# Patient Record
Sex: Female | Born: 1990 | Race: Black or African American | Hispanic: No | Marital: Married | Smoking: Never smoker
Health system: Southern US, Community
[De-identification: ages and names within clinical notes are randomized; demographics above are authoritative.]

## PROBLEM LIST (undated history)

## (undated) DIAGNOSIS — I10 Essential (primary) hypertension: Secondary | ICD-10-CM

## (undated) DIAGNOSIS — R87629 Unspecified abnormal cytological findings in specimens from vagina: Secondary | ICD-10-CM

## (undated) DIAGNOSIS — O10919 Unspecified pre-existing hypertension complicating pregnancy, unspecified trimester: Secondary | ICD-10-CM

## (undated) HISTORY — DX: Unspecified abnormal cytological findings in specimens from vagina: R87.629

## (undated) HISTORY — DX: Unspecified pre-existing hypertension complicating pregnancy, unspecified trimester: O10.919

## (undated) HISTORY — DX: Essential (primary) hypertension: I10

---

## 2010-10-10 DIAGNOSIS — O139 Gestational [pregnancy-induced] hypertension without significant proteinuria, unspecified trimester: Secondary | ICD-10-CM

## 2015-07-05 LAB — OB RESULTS CONSOLE GC/CHLAMYDIA
Chlamydia: NEGATIVE
Gonorrhea: NEGATIVE

## 2015-08-03 ENCOUNTER — Encounter: Payer: Self-pay | Admitting: *Deleted

## 2015-08-03 DIAGNOSIS — I10 Essential (primary) hypertension: Secondary | ICD-10-CM

## 2015-08-03 DIAGNOSIS — Z98891 History of uterine scar from previous surgery: Secondary | ICD-10-CM

## 2015-08-03 DIAGNOSIS — O99210 Obesity complicating pregnancy, unspecified trimester: Secondary | ICD-10-CM | POA: Insufficient documentation

## 2015-08-03 DIAGNOSIS — O34219 Maternal care for unspecified type scar from previous cesarean delivery: Secondary | ICD-10-CM | POA: Insufficient documentation

## 2015-08-03 DIAGNOSIS — O0990 Supervision of high risk pregnancy, unspecified, unspecified trimester: Secondary | ICD-10-CM | POA: Insufficient documentation

## 2015-08-06 LAB — HCG, QUANTITATIVE, PREGNANCY
GC Probe Amp, Genital: NEGATIVE
HEMATOCRIT: 33 %
HEMOGLOBIN (KUC): 10.6 g/dL — AB (ref 11.8–15.5)
PLATELETS: 340
Pap Smear: NEGATIVE
hCG Value: 108855

## 2015-08-28 ENCOUNTER — Encounter: Payer: Self-pay | Admitting: *Deleted

## 2015-08-28 ENCOUNTER — Ambulatory Visit (INDEPENDENT_AMBULATORY_CARE_PROVIDER_SITE_OTHER): Payer: BLUE CROSS/BLUE SHIELD | Admitting: Obstetrics & Gynecology

## 2015-08-28 ENCOUNTER — Encounter: Payer: Self-pay | Admitting: Obstetrics & Gynecology

## 2015-08-28 VITALS — BP 132/78 | HR 90 | Temp 98.4°F | Ht 62.0 in | Wt 300.0 lb

## 2015-08-28 DIAGNOSIS — O10911 Unspecified pre-existing hypertension complicating pregnancy, first trimester: Secondary | ICD-10-CM

## 2015-08-28 DIAGNOSIS — I10 Essential (primary) hypertension: Secondary | ICD-10-CM

## 2015-08-28 DIAGNOSIS — O99211 Obesity complicating pregnancy, first trimester: Secondary | ICD-10-CM

## 2015-08-28 DIAGNOSIS — O34219 Maternal care for unspecified type scar from previous cesarean delivery: Secondary | ICD-10-CM

## 2015-08-28 DIAGNOSIS — O0990 Supervision of high risk pregnancy, unspecified, unspecified trimester: Secondary | ICD-10-CM

## 2015-08-28 LAB — POCT URINALYSIS DIP (DEVICE)
Bilirubin Urine: NEGATIVE
Glucose, UA: NEGATIVE mg/dL
Ketones, ur: NEGATIVE mg/dL
Nitrite: NEGATIVE
PROTEIN: NEGATIVE mg/dL
SPECIFIC GRAVITY, URINE: 1.02 (ref 1.005–1.030)
UROBILINOGEN UA: 0.2 mg/dL (ref 0.0–1.0)
pH: 7 (ref 5.0–8.0)

## 2015-08-28 NOTE — Progress Notes (Signed)
Records received and abstracted. No prenatal profile done, will need drawn next visit.   Here for first prenatal visit. Given new ob education packet. Reports 3 episodes of vaginal bleeding.States first was dark , clotted. 2nd and 3rd episode were lighter red, light amount.  Saw Dr. Quincy SheehanHolyfield after each episode. Last episode was last week, no further bleeding. Declines flu.

## 2015-08-28 NOTE — Patient Instructions (Signed)
Vaginal Birth After Cesarean Delivery  Vaginal birth after cesarean delivery (VBAC) is giving birth vaginally after previously delivering a baby by a cesarean. In the past, if a woman had a cesarean delivery, all births afterward would be done by cesarean delivery. This is no longer true. It can be safe for the mother to try a vaginal delivery after having a cesarean delivery.   It is important to discuss VBAC with your health care provider early in the pregnancy so you can understand the risks, benefits, and options. It will give you time to decide what is best in your particular case. The final decision about whether to have a VBAC or repeat cesarean delivery should be between you and your health care provider. Any changes in your health or your baby's health during your pregnancy may make it necessary to change your initial decision about VBAC.   WOMEN WHO PLAN TO HAVE A VBAC SHOULD CHECK WITH THEIR HEALTH CARE PROVIDER TO BE SURE THAT:  · The previous cesarean delivery was done with a low transverse uterine cut (incision) (not a vertical classical incision).    · The birth canal is big enough for the baby.    · There were no other operations on the uterus.    · An electronic fetal monitor (EFM) will be on at all times during labor.    · An operating room will be available and ready in case an emergency cesarean delivery is needed.    · A health care provider and surgical nursing staff will be available at all times during labor to be ready to do an emergency delivery cesarean if necessary.    · An anesthesiologist will be present in case an emergency cesarean delivery is needed.    · The nursery is prepared and has adequate personnel and necessary equipment available to care for the baby in case of an emergency cesarean delivery.  BENEFITS OF VBAC  · Shorter stay in the hospital.    · Avoidance of risks associated with cesarean delivery, such as:    Surgical complications, such as opening of the incision or  hernia in the incision.    Injury to other organs.    Fever. This can occur if an infection develops after surgery. It can also occur as a reaction to the medicine given to make you numb during the surgery.  · Less blood loss and need for blood transfusions.  · Lower risk of blood clots and infection.   · Shorter recovery.    · Decreased risk for having to remove the uterus (hysterectomy).    · Decreased risk for the placenta to completely or partially cover the opening of the uterus (placenta previa) with a future pregnancy.    · Decrease risk in future labor and delivery.  RISKS OF A VBAC  · Tearing (rupture) of the uterus. This is occurs in less than 1% of VBACs. The risk of this happening is higher if:    Steps are taken to begin the labor process (induce labor) or stimulate or strengthen contractions (augment labor).      Medicine is used to soften (ripen) the cervix.  · Having to remove the uterus (hysterectomy) if it ruptures.  VBAC SHOULD NOT BE DONE IF:  · The previous cesarean delivery was done with a vertical (classical) or T-shaped incision or you do not know what kind of incision was made.    · You had a ruptured uterus.    · You have had certain types of surgery on your uterus, such as removal of uterine fibroids.   Ask your health care provider about other types of surgeries that prevent you from having a VBAC.  · You have certain medical or childbirth (obstetrical) problems.    · There are problems with the baby.    · You have had two previous cesarean deliveries and no vaginal deliveries.  OTHER FACTS TO KNOW ABOUT VBAC:  · It is safe to have an epidural anesthetic with VBAC.    · It is safe to turn the baby from a breech position (attempt an external cephalic version).    · It is safe to try a VBAC with twins.    · VBAC may not be successful if your baby weights 8.8 lb (4 kg) or more. However, weight predictions are not always accurate and should not be used alone to decide if VBAC is right for  you.  · There is an increased failure rate if the time between the cesarean delivery and VBAC is less than 19 months.    · Your health care provider may advise against a VBAC if you have preeclampsia (high blood pressure, protein in the urine, and swelling of face and extremities).    · VBAC is often successful if you previously gave birth vaginally.    · VBAC is often successful when the labor starts spontaneously before the due date.    · Delivering a baby through a VBAC is similar to having a normal spontaneous vaginal delivery.     This information is not intended to replace advice given to you by your health care provider. Make sure you discuss any questions you have with your health care provider.     Document Released: 02/02/2007 Document Revised: 09/02/2014 Document Reviewed: 03/11/2013  Elsevier Interactive Patient Education ©2016 Elsevier Inc.

## 2015-08-28 NOTE — Progress Notes (Signed)
[facility]  Subjective:referred [facility] due to h/o wound infection after cesarean and consider TOLAC    Sierra [name] is a [id number] 6534w5d being seen today for her first obstetrical visit.  Her obstetrical history is significant for obesity and previous cesarean section. Patient does intend to breast feed. Pregnancy history fully reviewed.  Patient reports nausea.  Filed Vitals:   08/28/15 0822 08/28/15 0829  BP:  132/78  Pulse:  90  Temp:  98.4 F (36.9 C)  Height: 5\' 2"  (1.575 m)   Weight:  136.079 kg (300 lb)    HISTORY: OB History  Gravida Para Term Preterm AB SAB TAB Ectopic Multiple Living  5 1 1  0 3 1 0 0 0 1    # Outcome Date GA Lbr Len/2nd Weight Sex Delivery Anes PTL Lv  5 Current           4 AB 2015          3 SAB 03/2013          2 AB 2014          1 Term 10/10/10   3.629 kg (8 lb) M CS-Unspec  N Y     Complications: PIH (pregnancy induced hypertension),Failure to progress in labor     Past Medical History  Diagnosis Date  . Hypertension   . Vaginal Pap smear, abnormal    Past Surgical History  Procedure Laterality Date  . Cesarean section     Family History  Problem Relation Age of Onset  . Hypertension Mother   . Kidney disease Father   . Diabetes Father   . High Cholesterol Father   . Autoimmune disease Father   . Cancer Paternal Grandmother     ovarian     Exam    Uterus:     Pelvic Exam:                                    Skin: normal coloration and turgor, no rashes    Neurologic: oriented, normal mood   Extremities: normal strength, tone, and muscle mass   HEENT PERRLA and sclera clear, anicteric   Mouth/Teeth dental hygiene good   Neck supple   Cardiovascular: regular rate and rhythm   Respiratory:  appears well, vitals normal, no respiratory distress, acyanotic, normal RR   Abdomen: soft, non-tender; bowel sounds normal; no masses,  no organomegaly     Assessment:    Pregnancy:  G4W1027G5P1031 Patient Active Problem List   Diagnosis Date Noted  . Supervision of high-risk pregnancy 08/03/2015  . History of C-section 08/03/2015  . Obesities, morbid (HCC) 08/03/2015  . Obesity in pregnancy 08/03/2015  . HTN (hypertension) 08/03/2015        Plan:     Initial labs drawn. Prenatal vitamins. Problem list reviewed and updated. Genetic Screening discussed First Screen: ordered.  Ultrasound discussed; fetal survey: 18 weeks.  Follow up in 4 weeks. 50% of 30 min visit spent on counseling and coordination of care.  TOLAC discussed   ARNOLD,JAMES 08/28/2015

## 2015-08-29 ENCOUNTER — Other Ambulatory Visit: Payer: Self-pay | Admitting: Obstetrics & Gynecology

## 2015-08-29 DIAGNOSIS — I10 Essential (primary) hypertension: Secondary | ICD-10-CM

## 2015-08-29 DIAGNOSIS — O0990 Supervision of high risk pregnancy, unspecified, unspecified trimester: Secondary | ICD-10-CM

## 2015-08-30 ENCOUNTER — Ambulatory Visit (HOSPITAL_COMMUNITY)
Admission: RE | Admit: 2015-08-30 | Discharge: 2015-08-30 | Disposition: A | Discharge status: Home / Self Care | Payer: BLUE CROSS/BLUE SHIELD | Source: Ambulatory Visit | Attending: Obstetrics & Gynecology | Admitting: Obstetrics & Gynecology

## 2015-08-30 ENCOUNTER — Other Ambulatory Visit (HOSPITAL_COMMUNITY): Payer: BLUE CROSS/BLUE SHIELD

## 2015-08-30 ENCOUNTER — Other Ambulatory Visit: Payer: Self-pay | Admitting: Obstetrics & Gynecology

## 2015-08-30 DIAGNOSIS — Z3A13 13 weeks gestation of pregnancy: Secondary | ICD-10-CM

## 2015-08-30 DIAGNOSIS — Z369 Encounter for antenatal screening, unspecified: Secondary | ICD-10-CM

## 2015-08-30 DIAGNOSIS — O34219 Maternal care for unspecified type scar from previous cesarean delivery: Secondary | ICD-10-CM

## 2015-08-30 DIAGNOSIS — O10011 Pre-existing essential hypertension complicating pregnancy, first trimester: Secondary | ICD-10-CM | POA: Diagnosis present

## 2015-08-30 DIAGNOSIS — O10019 Pre-existing essential hypertension complicating pregnancy, unspecified trimester: Secondary | ICD-10-CM

## 2015-08-30 DIAGNOSIS — O0990 Supervision of high risk pregnancy, unspecified, unspecified trimester: Secondary | ICD-10-CM

## 2015-08-30 DIAGNOSIS — Z36 Encounter for antenatal screening of mother: Secondary | ICD-10-CM | POA: Diagnosis not present

## 2015-08-30 DIAGNOSIS — O99211 Obesity complicating pregnancy, first trimester: Secondary | ICD-10-CM

## 2015-08-30 DIAGNOSIS — I10 Essential (primary) hypertension: Secondary | ICD-10-CM

## 2015-08-30 LAB — PRESCRIPTION MONITORING PROFILE (19 PANEL)
Amphetamine/Meth: NEGATIVE ng/mL
BARBITURATE SCREEN, URINE: NEGATIVE ng/mL
BENZODIAZEPINE SCREEN, URINE: NEGATIVE ng/mL
BUPRENORPHINE, URINE: NEGATIVE ng/mL
CANNABINOID SCRN UR: NEGATIVE ng/mL
CARISOPRODOL, URINE: NEGATIVE ng/mL
COCAINE METABOLITES: NEGATIVE ng/mL
CREATININE, URINE: 183.05 mg/dL (ref >20.0)
Fentanyl, Ur: NEGATIVE ng/mL
MDMA URINE: NEGATIVE ng/mL
METHAQUALONE SCREEN (URINE): NEGATIVE ng/mL
Meperidine, Ur: NEGATIVE ng/mL
Methadone Screen, Urine: NEGATIVE ng/mL
Nitrites, Initial: NEGATIVE ug/mL
OPIATE SCREEN, URINE: NEGATIVE ng/mL
OXYCODONE SCRN UR: NEGATIVE ng/mL
PH URINE, INITIAL: 7.5 pH (ref 4.5–8.9)
PHENCYCLIDINE, UR: NEGATIVE ng/mL
Propoxyphene: NEGATIVE ng/mL
Tapentadol, urine: NEGATIVE ng/mL
Tramadol Scrn, Ur: NEGATIVE ng/mL
Zolpidem, Urine: NEGATIVE ng/mL

## 2015-08-30 LAB — GLUCOSE TOLERANCE, 1 HOUR (50G) W/O FASTING: GLUCOSE 1 HOUR GTT: 99 mg/dL (ref 70–140)

## 2015-09-02 LAB — CULTURE, OB URINE

## 2015-09-04 ENCOUNTER — Telehealth: Payer: Self-pay | Admitting: *Deleted

## 2015-09-04 DIAGNOSIS — O2342 Unspecified infection of urinary tract in pregnancy, second trimester: Secondary | ICD-10-CM

## 2015-09-04 MED ORDER — NITROFURANTOIN MONOHYD MACRO 100 MG PO CAPS: 100.0000 mg | ORAL_CAPSULE | Freq: Two times a day (BID) | ORAL | Status: DC

## 2015-09-04 NOTE — Telephone Encounter (Signed)
Per Dr. Debroah LoopArnold urine culture shows UTI with E. Coli. RX ordered.   Called [facility] and informed her of UTI and need to take macrobid and that I will sent it to her pharmacy for her to start asap.  I informed her she should take her medicine bid until all doses taken. She voices understanding.

## 2015-09-28 ENCOUNTER — Ambulatory Visit (INDEPENDENT_AMBULATORY_CARE_PROVIDER_SITE_OTHER): Payer: BLUE CROSS/BLUE SHIELD | Admitting: Obstetrics & Gynecology

## 2015-09-28 VITALS — BP 124/78 | HR 92 | Temp 97.8°F | Wt 303.7 lb

## 2015-09-28 DIAGNOSIS — O10912 Unspecified pre-existing hypertension complicating pregnancy, second trimester: Secondary | ICD-10-CM | POA: Diagnosis not present

## 2015-09-28 DIAGNOSIS — O34219 Maternal care for unspecified type scar from previous cesarean delivery: Secondary | ICD-10-CM

## 2015-09-28 DIAGNOSIS — O10919 Unspecified pre-existing hypertension complicating pregnancy, unspecified trimester: Secondary | ICD-10-CM | POA: Insufficient documentation

## 2015-09-28 DIAGNOSIS — O99212 Obesity complicating pregnancy, second trimester: Secondary | ICD-10-CM

## 2015-09-28 DIAGNOSIS — O0992 Supervision of high risk pregnancy, unspecified, second trimester: Secondary | ICD-10-CM

## 2015-09-28 DIAGNOSIS — R079 Chest pain, unspecified: Secondary | ICD-10-CM

## 2015-09-28 DIAGNOSIS — O2342 Unspecified infection of urinary tract in pregnancy, second trimester: Secondary | ICD-10-CM

## 2015-09-28 LAB — COMPREHENSIVE METABOLIC PANEL
ALBUMIN: 3.3 g/dL — AB (ref 3.6–5.1)
ALT: 13 U/L (ref 6–29)
AST: 12 U/L (ref 10–30)
Alkaline Phosphatase: 39 U/L (ref 33–115)
BUN: 8 mg/dL (ref 7–25)
CHLORIDE: 107 mmol/L (ref 98–110)
CO2: 21 mmol/L (ref 20–31)
Calcium: 8.5 mg/dL — ABNORMAL LOW (ref 8.6–10.2)
Creat: 0.6 mg/dL (ref 0.50–1.10)
Glucose, Bld: 76 mg/dL (ref 65–99)
POTASSIUM: 3.9 mmol/L (ref 3.5–5.3)
Sodium: 138 mmol/L (ref 135–146)
Total Bilirubin: 0.2 mg/dL (ref 0.2–1.2)
Total Protein: 6.2 g/dL (ref 6.1–8.1)

## 2015-09-28 LAB — POCT URINALYSIS DIP (DEVICE)
Bilirubin Urine: NEGATIVE
GLUCOSE, UA: NEGATIVE mg/dL
KETONES UR: NEGATIVE mg/dL
Nitrite: POSITIVE — AB
Protein, ur: 30 mg/dL — AB
Urobilinogen, UA: 1 mg/dL (ref 0.0–1.0)
pH: 5.5 (ref 5.0–8.0)

## 2015-09-28 MED ORDER — SULFAMETHOXAZOLE-TRIMETHOPRIM 800-160 MG PO TABS: 1.0000 | ORAL_TABLET | Freq: Two times a day (BID) | ORAL | Status: DC

## 2015-09-28 MED ORDER — ASPIRIN EC 81 MG PO TBEC: 81.0000 mg | DELAYED_RELEASE_TABLET | Freq: Every day | ORAL | Status: DC

## 2015-09-28 NOTE — Progress Notes (Signed)
Subjective:  Sierra [name] is a 25 y.o. G5P1031 at [redacted]w[redacted]d being seen today for ongoing prenatal care.  She is currently monitored for the following issues for this high-risk pregnancy and has Supervision of high-risk pregnancy; Previous cesarean delivery, antepartum; Morbid obesity (HCC); Maternal morbid obesity, antepartum (HCC); HTN (hypertension); and Preexisting hypertension complicating pregnancy, antepartum on her problem list.  Patient reports intermittent left sided chest pain with SOB several times a day. Thinks it is heartburn, has not taken any Tums or other intervention. No radiation to arms or any other part of body. No history of heart issues but does have HTN.  No current chest pain and SOB.  Contractions: Not present. Vag. Bleeding: None.  Movement: Present. Denies leaking of fluid.   The following portions of the patient's history were reviewed and updated as appropriate: allergies, current medications, past family history, past medical history, past social history, past surgical history and problem list. Problem list updated.  Objective:   Filed Vitals:   09/28/15 1025  BP: 124/78  Pulse: 92  Temp: 97.8 F (36.6 C)  Weight: 303 lb 11.2 oz (137.757 kg)    Fetal Status: Fetal Heart Rate (bpm): 150   Movement: Present     General:  Alert, oriented and cooperative. Patient is in no acute distress.  Skin: Skin is warm and dry. No rash noted.   Cardiovascular: Normal heart rate noted  Respiratory: Normal respiratory effort, no problems with respiration noted  Abdomen: Soft, gravid, appropriate for gestational age. Pain/Pressure: Present     Pelvic: Vag. Bleeding: None    Cervical exam deferred        Extremities: Normal range of motion.  Edema: Trace  Mental Status: Normal mood and affect. Normal behavior. Normal judgment and thought content.   Urinalysis: Urine Protein: 1+ Urine Glucose: Negative  Assessment and Plan:  Pregnancy: G5P1031 at [redacted]w[redacted]d  1. Chest pain,  unspecified chest pain type Patient was told to try antacids but to go to ED for any worsening/recurrent symptoms; may need EKG/ECHO. Will check labs today. - B Nat Peptide - Troponin I  2. Preexisting hypertension complicating pregnancy, antepartum, second trimester Normotensive today.  Encouraged to start low dose ASA, this was prescribed. - [place] MFM OB COMP + 14 WK; Future - Prenatal Profile - Comprehensive metabolic panel - Protein / creatinine ratio, urine - aspirin EC 81 MG tablet; Take 1 tablet (81 mg total) by mouth daily. Take after 12 weeks for prevention of preeclampsia later in pregnancy  Dispense: 300 tablet; Refill: 2  3. UTI (urinary tract infection) in pregnancy, antepartum, second trimester Had culture with E.coli resistant to PCNs/cephalosporins. Had Macrobid, still symptomatic. Bactrim prescribed today. - sulfamethoxazole-trimethoprim (BACTRIM DS,SEPTRA DS) 800-160 MG tablet; Take 1 tablet by mouth 2 (two) times daily.  Dispense: 14 tablet; Refill: 1  4. Maternal morbid obesity, antepartum, second trimester (HCC) 1 hr GTT 99, will see Nutritionist today - [place] MFM OB COMP + 14 WK; Future  5. Previous cesarean delivery, antepartum Desires TOLAC, will sign papers later. Information given to her to review at home.  6. Supervision of high-risk pregnancy, second trimester Needs routine labs, anatomy scan - [place] MFM OB COMP + 14 WK; Future - Hemoglobinopathy evaluation - Prenatal Profile - AFP, Quad Screen  Routine obstetric precautions reviewed. Please refer to After Visit Summary for other counseling recommendations.  Return in about 4 weeks (around 10/26/2015) for OB Visit.   Tereso Newcomer, MD

## 2015-09-28 NOTE — Patient Instructions (Signed)
Return to clinic for any obstetric concerns or go to [facility] for evaluation  Trial of Labor After Cesarean Delivery Information A trial of labor after cesarean delivery (TOLAC) is when a woman tries to give birth vaginally after a previous cesarean delivery. TOLAC may be a safe and appropriate option for you depending on your medical history and other risk factors. When TOLAC is successful and you are able to have a vaginal delivery, this is called a vaginal birth after cesarean delivery (VBAC).  CANDIDATES FOR TOLAC TOLAC is possible for some women who:  Have undergone one or two prior cesarean deliveries in which the incision of the uterus was horizontal (low transverse).  Are carrying twins and have had one prior low transverse incision during a cesarean delivery.  Do not have a vertical (classical) uterine scar.  Have not had a tear in the wall of their uterus (uterine rupture). TOLAC is also supported for women who meet appropriate criteria and:  Are under the age of 40 years.  Are tall and have a body mass index (BMI) of less than 30.  Have an unknown uterine scar.  Give birth in a facility equipped to handle an emergency cesarean delivery. This team should be able to handle possible complications such as a uterine rupture.  Have thorough counseling about the benefits and risks of TOLAC.  Have discussed future pregnancy plans with their health care provider.  Plan to have several more pregnancies. MOST SUCCESSFUL CANDIDATES FOR TOLAC:  Have had a successful vaginal delivery before or after their cesarean delivery.  Experience labor that begins naturally on or before the due date (40 weeks of gestation).  Do not have a very large (macrosomic) baby.   Had a prior cesarean delivery but are not currently experiencing factors that would prompt a cesarean delivery (such as a breech position).  Had only one prior cesarean delivery.  Had a prior cesarean delivery that was  performed early in labor and not after full cervical dilation. TOLAC may be most appropriate for women who meet the above guidelines and who plan to have more pregnancies. TOLAC is not recommended for home births. LEAST SUCCESSFUL CANDIDATES FOR TOLAC:  Have an induced labor with an unfavorable cervix. An unfavorable cervix is when the cervix is not dilating enough (among other factors).  Have never had a vaginal delivery.  Have had more than two cesarean deliveries.  Have a pregnancy at more than 40 weeks of gestation.  Are pregnant with a baby with a suspected weight greater than 4,000 grams (8 pounds) and who have no prior history of a vaginal delivery.  Have closely spaced pregnancies. SUGGESTED BENEFITS OF TOLAC  You may have a faster recovery time.  You may have a shorter stay in the hospital.  You may have less pain and fewer problems than with a cesarean delivery. Women who have a cesarean delivery have a higher chance of needing blood or getting a fever, an infection, or a blood clot in the legs. SUGGESTED RISKS OF TOLAC The highest risk of complications happens to women who attempt a TOLAC and fail. A failed TOLAC results in an unplanned cesarean delivery. Risks related to TOLAC or repeat cesarean deliveries include:   Blood loss.  Infection.  Blood clot.  Injury to surrounding tissues or organs.  Having to remove the uterus (hysterectomy).  Potential problems with the placenta (such as placenta previa or placenta accreta) in future pregnancies. Although very rare, the main concerns with TOLAC are:    Rupture of the uterine scar from a past cesarean delivery.  Needing an emergency cesarean delivery.  Having a bad outcome for the baby (perinatal morbidity). FOR MORE INFORMATION American Congress of Obstetricians and Gynecologists: www.acog.org American College of Nurse-Midwives: www.midwife.org   This information is not intended to replace advice given to you by your  health care provider. Make sure you discuss any questions you have with your health care provider.   Document Released: 04/30/2011 Document Revised: 06/02/2013 Document Reviewed: 02/01/2013 Elsevier Interactive Patient Education 2016 Elsevier Inc.  

## 2015-09-28 NOTE — Addendum Note (Signed)
Addended by: Garret Reddish on: 09/28/2015 02:38 PM   Modules accepted: Orders

## 2015-09-28 NOTE — Progress Notes (Signed)
Nutrition note: 1st consult Pt has h/o obesity. Pt has lost 11.3# @ [redacted]w[redacted]d but has gained 3.7# in the past 4 wks. Pt reports she had N&V in the beginning but that it has improved. Pt also reports that she has been making some healthy changes such as eating more salads & fruit. Pt reports eating 2 meals & 3 snacks/d. Pt reports she does not eat until 10am (but is awake ~5am).  Pt is taking a PNV. Pt reports some nausea still and has heartburn. NKFA. Pt received verbal & written education on general nutrition during pregnancy. Discussed importance of eating breakfast soon after she wakes. Discussed tips to decrease N&V and heartburn. Discussed how sometimes wt loss during pregnancy can be from making healthy food choices - reviewed recommended number of servings of each food group & encouraged pt to compare her intake to these recommendations to make sure she is eating what she needs to during pregnancy. Discussed wt gain goals of 11-20# or 0.5#/wk. Pt agrees to try to start eating something closer to when she wakes up. Pt does not have WIC but lives in [place] & plans to apply soon. Pt plans to BF. F/u as needed Blondell Reveal, MS, RD, LDN, [facility]

## 2015-09-28 NOTE — Progress Notes (Signed)
Urine: positive nitrites, small amt wbcs, trace blood Pt c/o chest pain, left side with sob intermittently several times a day Breastfeeding tip of the week reviewed

## 2015-09-28 NOTE — Progress Notes (Signed)
Cancelled urine protein/creatinine ratio due to insufficient sample provided. Will obtain at next visit.

## 2015-09-28 NOTE — Progress Notes (Signed)
U/S scheduled fro 10/18/2015 :00AM

## 2015-09-29 LAB — AFP, QUAD SCREEN
AFP: 24 ng/mL
Curr Gest Age: 17.1 wks.days
HCG TOTAL: 20.01 [IU]/mL
INH: 74.2 pg/mL
INTERPRETATION-AFP: NEGATIVE
MOM FOR AFP: 1.18
MoM for INH: 0.72
MoM for hCG: 1.08
OPEN SPINA BIFIDA: NEGATIVE
Osb Risk: 1:7340 {titer}
Tri 18 Scr Risk Est: NEGATIVE
UE3 VALUE: 0.73 ng/mL
uE3 Mom: 0.82

## 2015-09-29 LAB — PRENATAL PROFILE (SOLSTAS)
ANTIBODY SCREEN: NEGATIVE
BASOS PCT: 0 % (ref 0–1)
Basophils Absolute: 0 10*3/uL (ref 0.0–0.1)
EOS ABS: 0.1 10*3/uL (ref 0.0–0.7)
EOS PCT: 1 % (ref 0–5)
HEMATOCRIT: 34.8 % — AB (ref 36.0–46.0)
HEMOGLOBIN: 10.8 g/dL — AB (ref 12.0–15.0)
HEP B S AG: NEGATIVE
HIV 1&2 Ab, 4th Generation: NONREACTIVE
Lymphocytes Relative: 23 % (ref 12–46)
Lymphs Abs: 2 10*3/uL (ref 0.7–4.0)
MCH: 21.9 pg — ABNORMAL LOW (ref 26.0–34.0)
MCHC: 31 g/dL (ref 30.0–36.0)
MCV: 70.4 fL — ABNORMAL LOW (ref 78.0–100.0)
MONO ABS: 0.5 10*3/uL (ref 0.1–1.0)
MONOS PCT: 6 % (ref 3–12)
MPV: 10.7 fL (ref 8.6–12.4)
Neutro Abs: 6.2 10*3/uL (ref 1.7–7.7)
Neutrophils Relative %: 70 % (ref 43–77)
Platelets: 314 10*3/uL (ref 150–400)
RBC: 4.94 MIL/uL (ref 3.87–5.11)
RDW: 16 % — ABNORMAL HIGH (ref 11.5–15.5)
RH TYPE: POSITIVE
RUBELLA: 4.45 {index} — AB (ref <0.90)
WBC: 8.8 10*3/uL (ref 4.0–10.5)

## 2015-09-29 LAB — TROPONIN I

## 2015-09-29 LAB — BRAIN NATRIURETIC PEPTIDE: Brain Natriuretic Peptide: 20.7 pg/mL (ref 0.0–100.0)

## 2015-10-02 LAB — HEMOGLOBINOPATHY EVALUATION
HEMOGLOBIN OTHER: 0 %
HGB A2 QUANT: 2.2 % (ref 2.2–3.2)
HGB A: 97.8 % (ref 96.8–97.8)
HGB S QUANTITAION: 0 %
Hgb F Quant: 0 % (ref 0.0–2.0)

## 2015-10-18 ENCOUNTER — Other Ambulatory Visit: Payer: Self-pay | Admitting: Obstetrics & Gynecology

## 2015-10-18 ENCOUNTER — Ambulatory Visit (HOSPITAL_COMMUNITY)
Admission: RE | Admit: 2015-10-18 | Discharge: 2015-10-18 | Disposition: A | Discharge status: Home / Self Care | Payer: BLUE CROSS/BLUE SHIELD | Source: Ambulatory Visit | Attending: Obstetrics & Gynecology | Admitting: Obstetrics & Gynecology

## 2015-10-18 ENCOUNTER — Encounter (HOSPITAL_COMMUNITY): Payer: Self-pay

## 2015-10-18 VITALS — BP 118/65 | HR 83 | Wt 304.0 lb

## 2015-10-18 DIAGNOSIS — O99210 Obesity complicating pregnancy, unspecified trimester: Secondary | ICD-10-CM

## 2015-10-18 DIAGNOSIS — O34219 Maternal care for unspecified type scar from previous cesarean delivery: Secondary | ICD-10-CM | POA: Diagnosis not present

## 2015-10-18 DIAGNOSIS — O99212 Obesity complicating pregnancy, second trimester: Secondary | ICD-10-CM

## 2015-10-18 DIAGNOSIS — Z3A20 20 weeks gestation of pregnancy: Secondary | ICD-10-CM | POA: Insufficient documentation

## 2015-10-18 DIAGNOSIS — Z6841 Body Mass Index (BMI) 40.0 and over, adult: Secondary | ICD-10-CM | POA: Diagnosis not present

## 2015-10-18 DIAGNOSIS — O10912 Unspecified pre-existing hypertension complicating pregnancy, second trimester: Secondary | ICD-10-CM | POA: Insufficient documentation

## 2015-10-18 DIAGNOSIS — Z3689 Encounter for other specified antenatal screening: Secondary | ICD-10-CM

## 2015-10-18 DIAGNOSIS — Z36 Encounter for antenatal screening of mother: Secondary | ICD-10-CM | POA: Diagnosis not present

## 2015-10-18 DIAGNOSIS — O0992 Supervision of high risk pregnancy, unspecified, second trimester: Secondary | ICD-10-CM

## 2015-10-26 ENCOUNTER — Ambulatory Visit (INDEPENDENT_AMBULATORY_CARE_PROVIDER_SITE_OTHER): Payer: BLUE CROSS/BLUE SHIELD | Admitting: Obstetrics and Gynecology

## 2015-10-26 VITALS — BP 113/63 | HR 91 | Temp 98.2°F | Wt 301.5 lb

## 2015-10-26 DIAGNOSIS — O2340 Unspecified infection of urinary tract in pregnancy, unspecified trimester: Secondary | ICD-10-CM

## 2015-10-26 DIAGNOSIS — O0990 Supervision of high risk pregnancy, unspecified, unspecified trimester: Secondary | ICD-10-CM

## 2015-10-26 DIAGNOSIS — O10912 Unspecified pre-existing hypertension complicating pregnancy, second trimester: Secondary | ICD-10-CM

## 2015-10-26 DIAGNOSIS — Z23 Encounter for immunization: Secondary | ICD-10-CM

## 2015-10-26 DIAGNOSIS — O10919 Unspecified pre-existing hypertension complicating pregnancy, unspecified trimester: Secondary | ICD-10-CM

## 2015-10-26 DIAGNOSIS — O2342 Unspecified infection of urinary tract in pregnancy, second trimester: Secondary | ICD-10-CM

## 2015-10-26 LAB — POCT URINALYSIS DIP (DEVICE)
BILIRUBIN URINE: NEGATIVE
Bilirubin Urine: NEGATIVE
GLUCOSE, UA: NEGATIVE mg/dL
GLUCOSE, UA: NEGATIVE mg/dL
HGB URINE DIPSTICK: NEGATIVE
Hgb urine dipstick: NEGATIVE
Ketones, ur: NEGATIVE mg/dL
Ketones, ur: NEGATIVE mg/dL
NITRITE: POSITIVE — AB
Nitrite: POSITIVE — AB
Protein, ur: NEGATIVE mg/dL
Protein, ur: NEGATIVE mg/dL
SPECIFIC GRAVITY, URINE: 1.02 (ref 1.005–1.030)
Specific Gravity, Urine: 1.02 (ref 1.005–1.030)
UROBILINOGEN UA: 1 mg/dL (ref 0.0–1.0)
Urobilinogen, UA: 2 mg/dL — ABNORMAL HIGH (ref 0.0–1.0)
pH: 7 (ref 5.0–8.0)
pH: 7 (ref 5.0–8.0)

## 2015-10-26 MED ORDER — NITROFURANTOIN MONOHYD MACRO 100 MG PO CAPS: 100.0000 mg | ORAL_CAPSULE | Freq: Every day | ORAL | Status: DC

## 2015-10-26 MED ORDER — SULFAMETHOXAZOLE-TRIMETHOPRIM 800-160 MG PO TABS: 1.0000 | ORAL_TABLET | Freq: Two times a day (BID) | ORAL | Status: DC

## 2015-10-26 NOTE — Progress Notes (Signed)
positive nitrites and moderate leukocytes. Pt reports that it still burns when she urinates, urine is cloudy and has foul odor.

## 2015-10-26 NOTE — Progress Notes (Signed)
Subjective:  Sierra [name] is a 25 y.o. G5P1031 at [redacted]w[redacted]d being seen today for ongoing prenatal care.  She is currently monitored for the following issues for this high-risk pregnancy and has Supervision of high-risk pregnancy; Previous cesarean delivery, antepartum; Morbid obesity (HCC); Maternal morbid obesity, antepartum (HCC); HTN (hypertension); and Preexisting hypertension complicating pregnancy, antepartum on her problem list.  Patient reports burning when urinates. No fever or nausea or back pain.  Contractions: Not present. Vag. Bleeding: None.  Movement: Present. Denies leaking of fluid.   The following portions of the patient's history were reviewed and updated as appropriate: allergies, current medications, past family history, past medical history, past social history, past surgical history and problem list. Problem list updated.  Objective:   Filed Vitals:   10/26/15 1138  BP: 113/63  Pulse: 91  Temp: 98.2 F (36.8 C)  Weight: 301 lb 8 oz (136.76 kg)    Fetal Status: Fetal Heart Rate (bpm): 155   Movement: Present     General:  Alert, oriented and cooperative. Patient is in no acute distress.  Skin: Skin is warm and dry. No rash noted.   Cardiovascular: Normal heart rate noted  Respiratory: Normal respiratory effort, no problems with respiration noted  Abdomen: Soft, gravid, appropriate for gestational age. Pain/Pressure: Absent   No cva tenderness.   Pelvic: Vag. Bleeding: None     Cervical exam deferred        Extremities: Normal range of motion.  Edema: Trace  Mental Status: Normal mood and affect. Normal behavior. Normal judgment and thought content.   Urinalysis: Urine Protein: Negative Urine Glucose: Negative  Assessment and Plan:  Pregnancy: G5P1031 at [redacted]w[redacted]d  1. Supervision of high-risk pregnancy, unspecified trimester - flu vaccine today  2. Preexisting hypertension complicating pregnancy, antepartum, unspecified trimester - bp wnl. Needs upc but uti  today which would likely falsely elevate  # UTI - symptoms, ua suggestive - bactrim  - macrobid ppx - pyelo return precautions - f/u culture  Preterm labor symptoms and general obstetric precautions including but not limited to vaginal bleeding, contractions, leaking of fluid and fetal movement were reviewed in detail with the patient. Please refer to After Visit Summary for other counseling recommendations.  F/u 4 wks  Kathrynn Running, MD

## 2015-10-28 LAB — CULTURE, OB URINE

## 2015-11-23 ENCOUNTER — Ambulatory Visit (INDEPENDENT_AMBULATORY_CARE_PROVIDER_SITE_OTHER): Payer: BLUE CROSS/BLUE SHIELD | Admitting: Obstetrics & Gynecology

## 2015-11-23 VITALS — BP 120/71 | HR 89 | Temp 98.0°F | Wt 305.1 lb

## 2015-11-23 DIAGNOSIS — O34219 Maternal care for unspecified type scar from previous cesarean delivery: Secondary | ICD-10-CM

## 2015-11-23 DIAGNOSIS — O0992 Supervision of high risk pregnancy, unspecified, second trimester: Secondary | ICD-10-CM

## 2015-11-23 DIAGNOSIS — O10912 Unspecified pre-existing hypertension complicating pregnancy, second trimester: Secondary | ICD-10-CM

## 2015-11-23 LAB — POCT URINALYSIS DIP (DEVICE)
BILIRUBIN URINE: NEGATIVE
Glucose, UA: NEGATIVE mg/dL
HGB URINE DIPSTICK: NEGATIVE
Ketones, ur: NEGATIVE mg/dL
NITRITE: NEGATIVE
Protein, ur: NEGATIVE mg/dL
Specific Gravity, Urine: 1.025 (ref 1.005–1.030)
Urobilinogen, UA: 1 mg/dL (ref 0.0–1.0)
pH: 6 (ref 5.0–8.0)

## 2015-11-23 NOTE — Progress Notes (Signed)
Subjective:  Sierra [name] is a 25 y.o. G5P1031 at 1348w1d being seen today for ongoing prenatal care.  She is currently monitored for the following issues for this high-risk pregnancy and has Supervision of high-risk pregnancy; Previous cesarean delivery, antepartum; Morbid obesity (HCC); Maternal morbid obesity, antepartum (HCC); HTN (hypertension); Preexisting hypertension complicating pregnancy, antepartum; and Recurrent UTI (urinary tract infection) complicating pregnancy on her problem list.  Patient reports no complaints.  Contractions: Not present. Vag. Bleeding: None.  Movement: Present. Denies leaking of fluid.   The following portions of the patient's history were reviewed and updated as appropriate: allergies, current medications, past family history, past medical history, past social history, past surgical history and problem list. Problem list updated.  Objective:   Filed Vitals:   11/23/15 1134  BP: 120/71  Pulse: 89  Temp: 98 F (36.7 C)  Weight: 305 lb 1.6 oz (138.392 kg)    Fetal Status: Fetal Heart Rate (bpm): 145   Movement: Present     General:  Alert, oriented and cooperative. Patient is in no acute distress.  Skin: Skin is warm and dry. No rash noted.   Cardiovascular: Normal heart rate noted  Respiratory: Normal respiratory effort, no problems with respiration noted  Abdomen: Soft, gravid, appropriate for gestational age. Pain/Pressure: Present     Pelvic: Vag. Bleeding: None    Cervical exam deferred        Extremities: Normal range of motion.  Edema: Trace  Mental Status: Normal mood and affect. Normal behavior. Normal judgment and thought content.   Urinalysis: Urine Protein: Negative Urine Glucose: Negative  Assessment and Plan:  Pregnancy: G5P1031 at 1048w1d  1. Previous cesarean delivery, antepartum Counseled about TOLAC vs RCS, risks/benefits reviewed. Desires TOLAC. Consent signed.  2. Preexisting hypertension complicating pregnancy, antepartum,  second trimester Stable BP. Continue Aspirin. Growth scan scheduled at 28 weeks (12/14/15).  3. Supervision of high-risk pregnancy, second trimester Preterm labor symptoms and general obstetric precautions including but not limited to vaginal bleeding, contractions, leaking of fluid and fetal movement were reviewed in detail with the patient. Please refer to After Visit Summary for other counseling recommendations.  Return in 3 weeks (on 12/14/2015) for 1 hr GTT, OB Visit, 3rd trimester labs, TDap.   Tereso NewcomerUgonna A Anyanwu, MD

## 2015-11-23 NOTE — Patient Instructions (Addendum)
Return to clinic for any scheduled appointments or obstetric concerns, or go to [facility] for evaluation  Tdap Vaccine (Tetanus, Diphtheria and Pertussis): What You Need to Know 1. Why get vaccinated? Tetanus, diphtheria and pertussis are very serious diseases. Tdap vaccine can protect us from these diseases. And, Tdap vaccine given to pregnant women can protect newborn babies against pertussis. TETANUS (Lockjaw) is rare in the [place] today. It causes painful muscle tightening and stiffness, usually all over the body.  It can lead to tightening of muscles in the head and neck so you can't open your mouth, swallow, or sometimes even breathe. Tetanus kills about 1 out of 10 people who are infected even after receiving the best medical care. DIPHTHERIA is also rare in the [place] today. It can cause a thick coating to form in the back of the throat.  It can lead to breathing problems, heart failure, paralysis, and death. PERTUSSIS (Whooping Cough) causes severe coughing spells, which can cause difficulty breathing, vomiting and disturbed sleep.  It can also lead to weight loss, incontinence, and rib fractures. Up to 2 in 100 adolescents and 5 in 100 adults with pertussis are hospitalized or have complications, which could include pneumonia or death. These diseases are caused by bacteria. Diphtheria and pertussis are spread from person to person through secretions from coughing or sneezing. Tetanus enters the body through cuts, scratches, or wounds. Before vaccines, as many as 200,000 cases of diphtheria, 200,000 cases of pertussis, and hundreds of cases of tetanus, were reported in the [place] each year. Since vaccination began, reports of cases for tetanus and diphtheria have dropped by about 99% and for pertussis by about 80%. 2. Tdap vaccine Tdap vaccine can protect adolescents and adults from tetanus, diphtheria, and pertussis. One dose of Tdap is routinely given at age 11 or 12.  People who did not get Tdap at that age should get it as soon as possible. Tdap is especially important for healthcare professionals and anyone having close contact with a baby younger than 12 months. Pregnant women should get a dose of Tdap during every pregnancy, to protect the newborn from pertussis. Infants are most at risk for severe, life-threatening complications from pertussis. Another vaccine, called Td, protects against tetanus and diphtheria, but not pertussis. A Td booster should be given every 10 years. Tdap may be given as one of these boosters if you have never gotten Tdap before. Tdap may also be given after a severe cut or burn to prevent tetanus infection. Your doctor or the person giving you the vaccine can give you more information. Tdap may safely be given at the same time as other vaccines. 3. Some people should not get this vaccine  A person who has ever had a life-threatening allergic reaction after a previous dose of any diphtheria, tetanus or pertussis containing vaccine, OR has a severe allergy to any part of this vaccine, should not get Tdap vaccine. Tell the person giving the vaccine about any severe allergies.  Anyone who had coma or long repeated seizures within 7 days after a childhood dose of DTP or DTaP, or a previous dose of Tdap, should not get Tdap, unless a cause other than the vaccine was found. They can still get Td.  Talk to your doctor if you:  have seizures or another nervous system problem,  had severe pain or swelling after any vaccine containing diphtheria, tetanus or pertussis,  ever had a condition called Guillain-Barr Syndrome (GBS),  aren't feeling   well on the day the shot is scheduled. 4. Risks With any medicine, including vaccines, there is a chance of side effects. These are usually mild and go away on their own. Serious reactions are also possible but are rare. Most people who get Tdap vaccine do not have any problems with it. Mild  problems following Tdap (Did not interfere with activities)  Pain where the shot was given (about 3 in 4 adolescents or 2 in 3 adults)  Redness or swelling where the shot was given (about 1 person in 5)  Mild fever of at least 100.4F (up to about 1 in 25 adolescents or 1 in 100 adults)  Headache (about 3 or 4 people in 10)  Tiredness (about 1 person in 3 or 4)  Nausea, vomiting, diarrhea, stomach ache (up to 1 in 4 adolescents or 1 in 10 adults)  Chills, sore joints (about 1 person in 10)  Body aches (about 1 person in 3 or 4)  Rash, swollen glands (uncommon) Moderate problems following Tdap (Interfered with activities, but did not require medical attention)  Pain where the shot was given (up to 1 in 5 or 6)  Redness or swelling where the shot was given (up to about 1 in 16 adolescents or 1 in 12 adults)  Fever over 102F (about 1 in 100 adolescents or 1 in 250 adults)  Headache (about 1 in 7 adolescents or 1 in 10 adults)  Nausea, vomiting, diarrhea, stomach ache (up to 1 or 3 people in 100)  Swelling of the entire arm where the shot was given (up to about 1 in 500). Severe problems following Tdap (Unable to perform usual activities; required medical attention)  Swelling, severe pain, bleeding and redness in the arm where the shot was given (rare). Problems that could happen after any vaccine:  People sometimes faint after a medical procedure, including vaccination. Sitting or lying down for about 15 minutes can help prevent fainting, and injuries caused by a fall. Tell your doctor if you feel dizzy, or have vision changes or ringing in the ears.  Some people get severe pain in the shoulder and have difficulty moving the arm where a shot was given. This happens very rarely.  Any medication can cause a severe allergic reaction. Such reactions from a vaccine are very rare, estimated at fewer than 1 in a million doses, and would happen within a few minutes to a few hours  after the vaccination. As with any medicine, there is a very remote chance of a vaccine causing a serious injury or death. The safety of vaccines is always being monitored. For more information, visit: www.cdc.gov/vaccinesafety/ 5. What if there is a serious problem? What should I look for?  Look for anything that concerns you, such as signs of a severe allergic reaction, very high fever, or unusual behavior.  Signs of a severe allergic reaction can include hives, swelling of the face and throat, difficulty breathing, a fast heartbeat, dizziness, and weakness. These would usually start a few minutes to a few hours after the vaccination. What should I do?  If you think it is a severe allergic reaction or other emergency that can't wait, call 9-1-1 or get the person to the nearest hospital. Otherwise, call your doctor.  Afterward, the reaction should be reported to the Vaccine Adverse Event Reporting System (VAERS). Your doctor might file this report, or you can do it yourself through the VAERS web site at www.vaers.hhs.gov, or by calling 1-800-822-7967. VAERS does not   give medical advice.  6. The National Vaccine Injury Compensation Program The National Vaccine Injury Compensation Program (VICP) is a federal program that was created to compensate people who may have been injured by certain vaccines. Persons who believe they may have been injured by a vaccine can learn about the program and about filing a claim by calling 1-800-338-2382 or visiting the VICP website at www.hrsa.gov/vaccinecompensation. There is a time limit to file a claim for compensation. 7. How can I learn more?  Ask your doctor. He or she can give you the vaccine package insert or suggest other sources of information.  Call your local or state health department.  Contact the Centers for Disease Control and Prevention (CDC):  Call 1-800-232-4636 (1-800-CDC-INFO) or  Visit CDC's website at www.cdc.gov/vaccines CDC Tdap  Vaccine VIS (10/19/13)   This information is not intended to replace advice given to you by your health care provider. Make sure you discuss any questions you have with your health care provider.   Document Released: 02/11/2012 Document Revised: 09/02/2014 Document Reviewed: 11/24/2013 Elsevier Interactive Patient Education 2016 Elsevier Inc.  

## 2015-11-28 ENCOUNTER — Encounter: Payer: Self-pay | Admitting: *Deleted

## 2015-12-13 ENCOUNTER — Ambulatory Visit (HOSPITAL_COMMUNITY): Payer: BLUE CROSS/BLUE SHIELD

## 2015-12-14 ENCOUNTER — Encounter (HOSPITAL_COMMUNITY): Payer: Self-pay

## 2015-12-14 ENCOUNTER — Other Ambulatory Visit (HOSPITAL_COMMUNITY): Payer: Self-pay | Admitting: Maternal and Fetal Medicine

## 2015-12-14 ENCOUNTER — Ambulatory Visit (HOSPITAL_COMMUNITY)
Admission: RE | Admit: 2015-12-14 | Discharge: 2015-12-14 | Disposition: A | Discharge status: Home / Self Care | Payer: BLUE CROSS/BLUE SHIELD | Source: Ambulatory Visit | Attending: Maternal and Fetal Medicine | Admitting: Maternal and Fetal Medicine

## 2015-12-14 ENCOUNTER — Ambulatory Visit (INDEPENDENT_AMBULATORY_CARE_PROVIDER_SITE_OTHER): Payer: BLUE CROSS/BLUE SHIELD | Admitting: Family

## 2015-12-14 VITALS — BP 114/63 | HR 84 | Wt 306.4 lb

## 2015-12-14 VITALS — BP 118/64 | HR 83 | Wt 304.5 lb

## 2015-12-14 DIAGNOSIS — O99213 Obesity complicating pregnancy, third trimester: Principal | ICD-10-CM

## 2015-12-14 DIAGNOSIS — Z3A28 28 weeks gestation of pregnancy: Secondary | ICD-10-CM

## 2015-12-14 DIAGNOSIS — O99210 Obesity complicating pregnancy, unspecified trimester: Secondary | ICD-10-CM | POA: Diagnosis not present

## 2015-12-14 DIAGNOSIS — O2343 Unspecified infection of urinary tract in pregnancy, third trimester: Secondary | ICD-10-CM

## 2015-12-14 DIAGNOSIS — O10019 Pre-existing essential hypertension complicating pregnancy, unspecified trimester: Secondary | ICD-10-CM

## 2015-12-14 DIAGNOSIS — O34219 Maternal care for unspecified type scar from previous cesarean delivery: Secondary | ICD-10-CM | POA: Insufficient documentation

## 2015-12-14 DIAGNOSIS — IMO0002 Reserved for concepts with insufficient information to code with codable children: Secondary | ICD-10-CM

## 2015-12-14 DIAGNOSIS — O10919 Unspecified pre-existing hypertension complicating pregnancy, unspecified trimester: Secondary | ICD-10-CM

## 2015-12-14 DIAGNOSIS — O10013 Pre-existing essential hypertension complicating pregnancy, third trimester: Secondary | ICD-10-CM | POA: Insufficient documentation

## 2015-12-14 DIAGNOSIS — Z36 Encounter for antenatal screening of mother: Secondary | ICD-10-CM | POA: Diagnosis not present

## 2015-12-14 DIAGNOSIS — O0993 Supervision of high risk pregnancy, unspecified, third trimester: Secondary | ICD-10-CM

## 2015-12-14 DIAGNOSIS — Z0489 Encounter for examination and observation for other specified reasons: Secondary | ICD-10-CM

## 2015-12-14 DIAGNOSIS — O10913 Unspecified pre-existing hypertension complicating pregnancy, third trimester: Secondary | ICD-10-CM

## 2015-12-14 DIAGNOSIS — Z23 Encounter for immunization: Secondary | ICD-10-CM | POA: Diagnosis not present

## 2015-12-14 LAB — POCT URINALYSIS DIP (DEVICE)
BILIRUBIN URINE: NEGATIVE
GLUCOSE, UA: NEGATIVE mg/dL
Hgb urine dipstick: NEGATIVE
KETONES UR: NEGATIVE mg/dL
Leukocytes, UA: NEGATIVE
Nitrite: NEGATIVE
PROTEIN: NEGATIVE mg/dL
SPECIFIC GRAVITY, URINE: 1.02 (ref 1.005–1.030)
Urobilinogen, UA: 0.2 mg/dL (ref 0.0–1.0)
pH: 6 (ref 5.0–8.0)

## 2015-12-14 LAB — CBC
HEMATOCRIT: 33.1 % — AB (ref 35.0–45.0)
Hemoglobin: 10.2 g/dL — ABNORMAL LOW (ref 11.7–15.5)
MCH: 22.1 pg — ABNORMAL LOW (ref 27.0–33.0)
MCHC: 30.8 g/dL — AB (ref 32.0–36.0)
MCV: 71.6 fL — AB (ref 80.0–100.0)
MPV: 9.9 fL (ref 7.5–12.5)
PLATELETS: 268 10*3/uL (ref 140–400)
RBC: 4.62 MIL/uL (ref 3.80–5.10)
RDW: 17 % — AB (ref 11.0–15.0)
WBC: 7.9 10*3/uL (ref 3.8–10.8)

## 2015-12-14 NOTE — Progress Notes (Signed)
Subjective:  Sierra [name] is a 25 y.o. G5P1031 at 6065w1d being seen today for ongoing prenatal care.  She is currently monitored for the following issues for this high-risk pregnancy and has Supervision of high-risk pregnancy; Previous cesarean delivery, antepartum; Morbid obesity (HCC); Maternal morbid obesity, antepartum (HCC); HTN (hypertension); Preexisting hypertension complicating pregnancy, antepartum; and Recurrent UTI (urinary tract infection) complicating pregnancy on her problem list.  Patient reports no complaints.  Contractions: Not present. Vag. Bleeding: None.  Movement: Present. Denies leaking of fluid.   The following portions of the patient's history were reviewed and updated as appropriate: allergies, current medications, past family history, past medical history, past social history, past surgical history and problem list. Problem list updated.  Objective:   Filed Vitals:   12/14/15 0854  BP: 118/64  Pulse: 83  Weight: 304 lb 8 oz (138.12 kg)    Fetal Status: Fetal Heart Rate (bpm): 145 Fundal Height: 31 cm Movement: Present     General:  Alert, oriented and cooperative. Patient is in no acute distress.  Skin: Skin is warm and dry. No rash noted.   Cardiovascular: Normal heart rate noted  Respiratory: Normal respiratory effort, no problems with respiration noted  Abdomen: Soft, gravid, appropriate for gestational age. Pain/Pressure: Present     Pelvic: Vag. Bleeding: None     Cervical exam deferred        Extremities: Normal range of motion.  Edema: None  Mental Status: Normal mood and affect. Normal behavior. Normal judgment and thought content.   Urinalysis:     Urine results not available at discharge.     Assessment and Plan:  Pregnancy: G5P1031 at 8665w1d  1. Need for Tdap vaccination - Tdap vaccine greater than or equal to 7yo IM  2. Supervision of high-risk pregnancy, third trimester - RPR - CBC - HIV antibody - Glucose Tolerance, 1 HR (50g) -  Reviewed growth ultrasound from this morning (39%ile)  3. Previous cesarean delivery, antepartum - Consent on file  4. UTI in pregnancy, antepartum, third trimester - Urine culture sent  Preterm labor symptoms and general obstetric precautions including but not limited to vaginal bleeding, contractions, leaking of fluid and fetal movement were reviewed in detail with the patient. Please refer to After Visit Summary for other counseling recommendations.  Return in about 2 weeks (around 12/28/2015).   Eino FarberWalidah Kennith GainN [name], CNM

## 2015-12-15 LAB — CULTURE, OB URINE

## 2015-12-15 LAB — GLUCOSE TOLERANCE, 1 HOUR (50G) W/O FASTING: Glucose, 1 Hr, gestational: 127 mg/dL (ref <140)

## 2015-12-15 LAB — RPR

## 2015-12-15 LAB — HIV ANTIBODY (ROUTINE TESTING W REFLEX): HIV 1&2 Ab, 4th Generation: NONREACTIVE

## 2015-12-28 ENCOUNTER — Ambulatory Visit (INDEPENDENT_AMBULATORY_CARE_PROVIDER_SITE_OTHER): Payer: BLUE CROSS/BLUE SHIELD | Admitting: Family

## 2015-12-28 VITALS — BP 121/71 | HR 71 | Wt 308.6 lb

## 2015-12-28 DIAGNOSIS — O10913 Unspecified pre-existing hypertension complicating pregnancy, third trimester: Secondary | ICD-10-CM

## 2015-12-28 DIAGNOSIS — O0993 Supervision of high risk pregnancy, unspecified, third trimester: Secondary | ICD-10-CM

## 2015-12-28 DIAGNOSIS — O2343 Unspecified infection of urinary tract in pregnancy, third trimester: Secondary | ICD-10-CM

## 2015-12-28 LAB — POCT URINALYSIS DIP (DEVICE)
Bilirubin Urine: NEGATIVE
Glucose, UA: NEGATIVE mg/dL
HGB URINE DIPSTICK: NEGATIVE
Ketones, ur: NEGATIVE mg/dL
LEUKOCYTES UA: NEGATIVE
NITRITE: NEGATIVE
PROTEIN: NEGATIVE mg/dL
Specific Gravity, Urine: 1.025 (ref 1.005–1.030)
UROBILINOGEN UA: 1 mg/dL (ref 0.0–1.0)
pH: 6 (ref 5.0–8.0)

## 2015-12-28 NOTE — Progress Notes (Signed)
Subjective:  Sierra [name] is a 25 y.o. G5P1031 at 5335w1d being seen today for ongoing prenatal care.  She is currently monitored for the following issues for this high-risk pregnancy and has Supervision of high-risk pregnancy; Previous cesarean delivery, antepartum; Morbid obesity (HCC); Maternal morbid obesity, antepartum (HCC); HTN (hypertension); Preexisting hypertension complicating pregnancy, antepartum; and Recurrent UTI (urinary tract infection) complicating pregnancy on her problem list.  Patient reports hip and joint pain with walking.  Contractions: Not present. Vag. Bleeding: None.  Movement: Present. Denies leaking of fluid.   The following portions of the patient's history were reviewed and updated as appropriate: allergies, current medications, past family history, past medical history, past social history, past surgical history and problem list. Problem list updated.  Objective:   Filed Vitals:   12/28/15 1003  BP: 121/71  Pulse: 71  Weight: 308 lb 9.6 oz (139.98 kg)    Fetal Status: Fetal Heart Rate (bpm): 131 Fundal Height: 32 cm Movement: Present     General:  Alert, oriented and cooperative. Patient is in no acute distress.  Skin: Skin is warm and dry. No rash noted.   Cardiovascular: Normal heart rate noted  Respiratory: Normal respiratory effort, no problems with respiration noted  Abdomen: Soft, gravid, appropriate for gestational age. Pain/Pressure: Present     Pelvic: Vag. Bleeding: None     Cervical exam deferred        Extremities: Normal range of motion.  Edema: None  Mental Status: Normal mood and affect. Normal behavior. Normal judgment and thought content.   Urinalysis: Urine Protein: Negative Urine Glucose: Negative  Assessment and Plan:  Pregnancy: G5P1031 at 7135w1d  1. Supervision of high-risk pregnancy, third trimester - Close monitoring  2. Preexisting hypertension complicating pregnancy, antepartum, third trimester - Begin fetal testing at  next visit (32 wks) - Growth ultrasound scheduled for 01/11/16  3. Recurrent UTI (urinary tract infection) complicating pregnancy, third trimester - Reviewed last urine culture results (neg)  Preterm labor symptoms and general obstetric precautions including but not limited to vaginal bleeding, contractions, leaking of fluid and fetal movement were reviewed in detail with the patient. Please refer to After Visit Summary for other counseling recommendations.  Return in about 2 weeks (around 01/11/2016) for appt and NST.   Eino FarberWalidah Kennith GainN Karim, CNM

## 2016-01-09 ENCOUNTER — Other Ambulatory Visit: Payer: BLUE CROSS/BLUE SHIELD

## 2016-01-11 ENCOUNTER — Ambulatory Visit (HOSPITAL_COMMUNITY): Payer: BLUE CROSS/BLUE SHIELD

## 2016-01-12 ENCOUNTER — Ambulatory Visit (HOSPITAL_COMMUNITY)
Admission: RE | Admit: 2016-01-12 | Discharge: 2016-01-12 | Disposition: A | Discharge status: Home / Self Care | Payer: BLUE CROSS/BLUE SHIELD | Source: Ambulatory Visit | Attending: Family | Admitting: Family

## 2016-01-12 ENCOUNTER — Encounter (HOSPITAL_COMMUNITY): Payer: Self-pay

## 2016-01-12 ENCOUNTER — Ambulatory Visit (INDEPENDENT_AMBULATORY_CARE_PROVIDER_SITE_OTHER): Payer: BLUE CROSS/BLUE SHIELD | Admitting: Family Medicine

## 2016-01-12 ENCOUNTER — Other Ambulatory Visit (HOSPITAL_COMMUNITY): Payer: Self-pay | Admitting: Maternal and Fetal Medicine

## 2016-01-12 VITALS — BP 114/66 | HR 88 | Wt 306.8 lb

## 2016-01-12 VITALS — BP 104/60 | HR 90 | Wt 304.7 lb

## 2016-01-12 DIAGNOSIS — O34219 Maternal care for unspecified type scar from previous cesarean delivery: Secondary | ICD-10-CM | POA: Insufficient documentation

## 2016-01-12 DIAGNOSIS — O99210 Obesity complicating pregnancy, unspecified trimester: Secondary | ICD-10-CM

## 2016-01-12 DIAGNOSIS — O10013 Pre-existing essential hypertension complicating pregnancy, third trimester: Secondary | ICD-10-CM | POA: Insufficient documentation

## 2016-01-12 DIAGNOSIS — O10019 Pre-existing essential hypertension complicating pregnancy, unspecified trimester: Secondary | ICD-10-CM

## 2016-01-12 DIAGNOSIS — O0993 Supervision of high risk pregnancy, unspecified, third trimester: Secondary | ICD-10-CM

## 2016-01-12 DIAGNOSIS — O10919 Unspecified pre-existing hypertension complicating pregnancy, unspecified trimester: Secondary | ICD-10-CM

## 2016-01-12 DIAGNOSIS — O99213 Obesity complicating pregnancy, third trimester: Secondary | ICD-10-CM | POA: Diagnosis not present

## 2016-01-12 DIAGNOSIS — O10913 Unspecified pre-existing hypertension complicating pregnancy, third trimester: Secondary | ICD-10-CM

## 2016-01-12 LAB — POCT URINALYSIS DIP (DEVICE)
BILIRUBIN URINE: NEGATIVE
Glucose, UA: NEGATIVE mg/dL
Hgb urine dipstick: NEGATIVE
Ketones, ur: NEGATIVE mg/dL
NITRITE: NEGATIVE
Protein, ur: NEGATIVE mg/dL
Specific Gravity, Urine: 1.02 (ref 1.005–1.030)
UROBILINOGEN UA: 1 mg/dL (ref 0.0–1.0)
pH: 7 (ref 5.0–8.0)

## 2016-01-12 NOTE — Progress Notes (Signed)
Subjective:  Lorre Munroeenisha Minder is a 25 y.o. G5P1031 at 412w2d being seen today for ongoing prenatal care.  She is currently monitored for the following issues for this high-risk pregnancy and has Supervision of high-risk pregnancy; Previous cesarean delivery, antepartum; Morbid obesity (HCC); Maternal morbid obesity, antepartum (HCC); HTN (hypertension); Preexisting hypertension complicating pregnancy, antepartum; and Recurrent UTI (urinary tract infection) complicating pregnancy on her problem list.  Patient reports no complaints.  Contractions: Not present. Vag. Bleeding: None.  Movement: Present. Denies leaking of fluid.   The following portions of the patient's history were reviewed and updated as appropriate: allergies, current medications, past family history, past medical history, past social history, past surgical history and problem list. Problem list updated.  Objective:   Filed Vitals:   01/12/16 1030  BP: 104/60  Pulse: 90  Weight: 304 lb 11.2 oz (138.211 kg)    Fetal Status: Fetal Heart Rate (bpm): NST   Movement: Present     General:  Alert, oriented and cooperative. Patient is in no acute distress.  Skin: Skin is warm and dry. No rash noted.   Cardiovascular: Normal heart rate noted  Respiratory: Normal respiratory effort, no problems with respiration noted  Abdomen: Soft, gravid, appropriate for gestational age. Pain/Pressure: Present     Pelvic: Vag. Bleeding: None     Cervical exam deferred        Extremities: Normal range of motion.  Edema: Trace  Mental Status: Normal mood and affect. Normal behavior. Normal judgment and thought content.   Urinalysis: Urine Protein: Negative Urine Glucose: Negative  Assessment and Plan:  Pregnancy: G5P1031 at 362w2d  1. Preexisting hypertension complicating pregnancy, antepartum, third trimester US today: 1609g (3#9oz) 25%tile.  FL <3%. NST twice weekly, NST reactive today. - Fetal nonstress test  2. Supervision of high-risk  pregnancy, third trimester FH normal  Preterm labor symptoms and general obstetric precautions including but not limited to vaginal bleeding, contractions, leaking of fluid and fetal movement were reviewed in detail with the patient. Please refer to After Visit Summary for other counseling recommendations.  Return for as scheduled.   Levie HeritageJacob J Stinson, DO

## 2016-01-12 NOTE — Progress Notes (Signed)
US for growth done today. Pt is not able to attend 2 appts next week for fetal testing due to conflicts with child's school schedule. She will return in 1 week.  Sx of pre-eclampsia reviewed and pt instructed to return to hospital if these should occur.

## 2016-01-18 ENCOUNTER — Ambulatory Visit (INDEPENDENT_AMBULATORY_CARE_PROVIDER_SITE_OTHER): Payer: BLUE CROSS/BLUE SHIELD | Admitting: Obstetrics & Gynecology

## 2016-01-18 VITALS — BP 119/67 | HR 91 | Wt 305.0 lb

## 2016-01-18 DIAGNOSIS — Z36 Encounter for antenatal screening of mother: Secondary | ICD-10-CM | POA: Diagnosis not present

## 2016-01-18 DIAGNOSIS — O0993 Supervision of high risk pregnancy, unspecified, third trimester: Secondary | ICD-10-CM

## 2016-01-18 DIAGNOSIS — O99213 Obesity complicating pregnancy, third trimester: Secondary | ICD-10-CM

## 2016-01-18 DIAGNOSIS — O10913 Unspecified pre-existing hypertension complicating pregnancy, third trimester: Secondary | ICD-10-CM | POA: Diagnosis not present

## 2016-01-18 LAB — POCT URINALYSIS DIP (DEVICE)
Glucose, UA: NEGATIVE mg/dL
HGB URINE DIPSTICK: NEGATIVE
KETONES UR: 40 mg/dL — AB
Leukocytes, UA: NEGATIVE
Nitrite: NEGATIVE
PH: 6 (ref 5.0–8.0)
Protein, ur: 30 mg/dL — AB
SPECIFIC GRAVITY, URINE: 1.025 (ref 1.005–1.030)
Urobilinogen, UA: 4 mg/dL — ABNORMAL HIGH (ref 0.0–1.0)

## 2016-01-18 NOTE — Patient Instructions (Signed)

## 2016-01-18 NOTE — Progress Notes (Signed)
Pt states she has difficulty coming to twice weekly appts due to the travel distance - will have weekly appts w/ NST and BPP.  Next US for growth on 6/29

## 2016-01-18 NOTE — Progress Notes (Signed)
Breastfeeding discussed with patient  

## 2016-01-18 NOTE — Progress Notes (Signed)
Subjective:  Sierra [name] is a 25 y.o. G5P1031 at 7211w1d being seen today for ongoing prenatal care.  She is currently monitored for the following issues for this high-risk pregnancy and has Supervision of high-risk pregnancy; Previous cesarean delivery, antepartum; Morbid obesity (HCC); Maternal morbid obesity, antepartum (HCC); HTN (hypertension); Preexisting hypertension complicating pregnancy, antepartum; and Recurrent UTI (urinary tract infection) complicating pregnancy on her problem list.  Patient reports no complaints.  Contractions: Not present. Vag. Bleeding: None.  Movement: Present. Denies leaking of fluid.   The following portions of the patient's history were reviewed and updated as appropriate: allergies, current medications, past family history, past medical history, past social history, past surgical history and problem list. Problem list updated.  Objective:   Filed Vitals:   01/18/16 1141  BP: 119/67  Pulse: 91  Weight: 305 lb (138.347 kg)    Fetal Status:     Movement: Present     General:  Alert, oriented and cooperative. Patient is in no acute distress.  Skin: Skin is warm and dry. No rash noted.   Cardiovascular: Normal heart rate noted  Respiratory: Normal respiratory effort, no problems with respiration noted  Abdomen: Soft, gravid, appropriate for gestational age. Pain/Pressure: Present     Pelvic: Vag. Bleeding: None     Cervical exam deferred        Extremities: Normal range of motion.  Edema: None  Mental Status: Normal mood and affect. Normal behavior. Normal judgment and thought content.   Urinalysis: Urine Protein: 1+ Urine Glucose: Negative  Assessment and Plan:  Pregnancy: G5P1031 at 6911w1d  1. Preexisting hypertension complicating pregnancy, antepartum, third trimester Normal BP on no medication - Amniotic fluid index with NST - US MFM FETAL BPP WO NON STRESS; Future - US MFM FETAL BPP WO NON STRESS; Future - US MFM FETAL BPP WO NON STRESS;  Future - US MFM FETAL BPP WO NON STRESS; Future - US MFM FETAL BPP WO NON STRESS; Future NST reactive today 2. Supervision of high-risk pregnancy, third trimester Doing well  Preterm labor symptoms and general obstetric precautions including but not limited to vaginal bleeding, contractions, leaking of fluid and fetal movement were reviewed in detail with the patient. Please refer to After Visit Summary for other counseling recommendations.  Return in about 5 days (around 01/23/2016) for NST @ 1600 (overbook).   Adam PhenixJames G Selby Foisy, MD

## 2016-01-25 ENCOUNTER — Ambulatory Visit (HOSPITAL_COMMUNITY)
Admission: RE | Admit: 2016-01-25 | Discharge: 2016-01-25 | Disposition: A | Discharge status: Home / Self Care | Payer: BLUE CROSS/BLUE SHIELD | Source: Ambulatory Visit | Attending: Obstetrics & Gynecology | Admitting: Obstetrics & Gynecology

## 2016-01-25 ENCOUNTER — Other Ambulatory Visit: Payer: Self-pay | Admitting: Obstetrics & Gynecology

## 2016-01-25 ENCOUNTER — Ambulatory Visit (INDEPENDENT_AMBULATORY_CARE_PROVIDER_SITE_OTHER): Payer: BLUE CROSS/BLUE SHIELD | Admitting: Family

## 2016-01-25 VITALS — BP 124/75 | HR 86 | Wt 308.0 lb

## 2016-01-25 DIAGNOSIS — O99213 Obesity complicating pregnancy, third trimester: Secondary | ICD-10-CM

## 2016-01-25 DIAGNOSIS — O10913 Unspecified pre-existing hypertension complicating pregnancy, third trimester: Secondary | ICD-10-CM | POA: Insufficient documentation

## 2016-01-25 DIAGNOSIS — O0992 Supervision of high risk pregnancy, unspecified, second trimester: Secondary | ICD-10-CM

## 2016-01-25 DIAGNOSIS — O34219 Maternal care for unspecified type scar from previous cesarean delivery: Secondary | ICD-10-CM

## 2016-01-25 DIAGNOSIS — Z3A34 34 weeks gestation of pregnancy: Secondary | ICD-10-CM | POA: Insufficient documentation

## 2016-01-25 LAB — POCT URINALYSIS DIP (DEVICE)
Bilirubin Urine: NEGATIVE
Glucose, UA: NEGATIVE mg/dL
Hgb urine dipstick: NEGATIVE
Ketones, ur: NEGATIVE mg/dL
Leukocytes, UA: NEGATIVE
Nitrite: NEGATIVE
Protein, ur: NEGATIVE mg/dL
Specific Gravity, Urine: 1.025 (ref 1.005–1.030)
Urobilinogen, UA: 1 mg/dL (ref 0.0–1.0)
pH: 6 (ref 5.0–8.0)

## 2016-01-25 NOTE — Progress Notes (Signed)
Subjective:  Sierra [name] is a 25 y.o. G5P1031 at 8644w1d being seen today for ongoing prenatal care.  She is currently monitored for the following issues for this low-risk pregnancy and has Supervision of high-risk pregnancy; Previous cesarean delivery, antepartum; Morbid obesity (HCC); Maternal morbid obesity, antepartum (HCC); HTN (hypertension); Preexisting hypertension complicating pregnancy, antepartum; and Recurrent UTI (urinary tract infection) complicating pregnancy on her problem list.  Patient reports no complaints.  Contractions: Not present. Vag. Bleeding: None.  Movement: Present. Denies leaking of fluid.   The following portions of the patient's history were reviewed and updated as appropriate: allergies, current medications, past family history, past medical history, past social history, past surgical history and problem list. Problem list updated.  Objective:   Filed Vitals:   01/25/16 1036  BP: 124/75  Pulse: 86  Weight: 308 lb (139.708 kg)    Fetal Status: Fetal Heart Rate (bpm): NST-R Fundal Height: 35 cm Movement: Present     General:  Alert, oriented and cooperative. Patient is in no acute distress.  Skin: Skin is warm and dry. No rash noted.   Cardiovascular: Normal heart rate noted  Respiratory: Normal respiratory effort, no problems with respiration noted  Abdomen: Soft, gravid, appropriate for gestational age. Pain/Pressure: Present     Pelvic: Vag. Bleeding: None     Cervical exam deferred        Extremities: Normal range of motion.  Edema: None  Mental Status: Normal mood and affect. Normal behavior. Normal judgment and thought content.   Urinalysis:    Glucose negative Protein negative  Assessment and Plan:  Pregnancy: G5P1031 at 3544w1d  1. Preexisting hypertension complicating pregnancy, antepartum, third trimester - Fetal nonstress test > reactive - AFI/NST MFM weekly and NST every Thursday  2. Supervision of high-risk pregnancy, second  trimester - Close monitoring  3. Previous cesarean delivery, antepartum - TOLAC signed  Preterm labor symptoms and general obstetric precautions including but not limited to vaginal bleeding, contractions, leaking of fluid and fetal movement were reviewed in detail with the patient. Please refer to After Visit Summary for other counseling recommendations.  Return in about 7 days (around 02/01/2016) for as scheduled, NST 2/wk and appt in 2 weeks.   Eino FarberWalidah Kennith GainN Karim, CNM

## 2016-01-25 NOTE — Progress Notes (Signed)
BPP done today and scheduled weekly due to pt's travel distance.

## 2016-02-01 ENCOUNTER — Ambulatory Visit (HOSPITAL_COMMUNITY)
Admission: RE | Admit: 2016-02-01 | Discharge: 2016-02-01 | Disposition: A | Discharge status: Home / Self Care | Payer: BLUE CROSS/BLUE SHIELD | Source: Ambulatory Visit | Attending: Obstetrics & Gynecology | Admitting: Obstetrics & Gynecology

## 2016-02-01 ENCOUNTER — Ambulatory Visit (INDEPENDENT_AMBULATORY_CARE_PROVIDER_SITE_OTHER): Payer: BLUE CROSS/BLUE SHIELD | Admitting: Obstetrics and Gynecology

## 2016-02-01 VITALS — BP 108/71 | HR 104 | Wt 307.4 lb

## 2016-02-01 DIAGNOSIS — O2343 Unspecified infection of urinary tract in pregnancy, third trimester: Secondary | ICD-10-CM

## 2016-02-01 DIAGNOSIS — O99213 Obesity complicating pregnancy, third trimester: Secondary | ICD-10-CM | POA: Insufficient documentation

## 2016-02-01 DIAGNOSIS — O99210 Obesity complicating pregnancy, unspecified trimester: Secondary | ICD-10-CM

## 2016-02-01 DIAGNOSIS — Z3A35 35 weeks gestation of pregnancy: Secondary | ICD-10-CM | POA: Diagnosis not present

## 2016-02-01 DIAGNOSIS — O34219 Maternal care for unspecified type scar from previous cesarean delivery: Secondary | ICD-10-CM | POA: Insufficient documentation

## 2016-02-01 DIAGNOSIS — O10013 Pre-existing essential hypertension complicating pregnancy, third trimester: Secondary | ICD-10-CM | POA: Diagnosis present

## 2016-02-01 DIAGNOSIS — O10913 Unspecified pre-existing hypertension complicating pregnancy, third trimester: Secondary | ICD-10-CM | POA: Diagnosis not present

## 2016-02-01 DIAGNOSIS — O0993 Supervision of high risk pregnancy, unspecified, third trimester: Secondary | ICD-10-CM

## 2016-02-01 LAB — POCT URINALYSIS DIP (DEVICE)
Bilirubin Urine: NEGATIVE
Glucose, UA: NEGATIVE mg/dL
Hgb urine dipstick: NEGATIVE
KETONES UR: 15 mg/dL — AB
Leukocytes, UA: NEGATIVE
Nitrite: NEGATIVE
PH: 5.5 (ref 5.0–8.0)
PROTEIN: NEGATIVE mg/dL
SPECIFIC GRAVITY, URINE: 1.025 (ref 1.005–1.030)
Urobilinogen, UA: 1 mg/dL (ref 0.0–1.0)

## 2016-02-01 NOTE — Progress Notes (Signed)
Subjective:  Sierra [name] is a 25 y.o. G5P1031 at 817w1d being seen today for ongoing prenatal care.   She is currently monitored for the following issues for this high-risk pregnancy and has Supervision of high-risk pregnancy; Previous cesarean delivery, antepartum; Maternal morbid obesity, antepartum (HCC); Preexisting hypertension complicating pregnancy, antepartum; and Recurrent UTI (urinary tract infection) complicating pregnancy on her problem list.  Patient reports Pt tripped a few days ago and fell on left wrist and sore. No belly trauma  Contractions: Not present. Vag. Bleeding: None.  Movement: Present. Denies leaking of fluid.   The following portions of the patient's history were reviewed and updated as appropriate: allergies, current medications, past family history, past medical history, past social history, past surgical history and problem list. Problem list updated.  Objective:   Filed Vitals:   02/01/16 1038  BP: 108/71  Pulse: 104  Weight: 307 lb 6.4 oz (139.436 kg)    Fetal Status: Fetal Heart Rate (bpm): NST   Movement: Present     General:  Alert, oriented and cooperative. Patient is in no acute distress.  Skin: Skin is warm and dry. No rash noted.   Cardiovascular: Normal heart rate noted  Respiratory: Normal respiratory effort, no problems with respiration noted  Abdomen: Soft, gravid, appropriate for gestational age. Pain/Pressure: Present     Pelvic:  Cervical exam deferred        Extremities: Normal range of motion. Slightly swollen at left wrist. Nl sensation 5/5 strength.  Edema: None  Mental Status: Normal mood and affect. Normal behavior. Normal judgment and thought content.   Urinalysis: Urine Protein: Negative Urine Glucose: Negative  Assessment and Plan:  Pregnancy: G5P1031 at 347w1d  1. Preexisting hypertension complicating pregnancy, antepartum, third trimester rNST today and f/u BPP final read from today. Normal growth on 5/19. Continue with  2x/wk testing, qmonth growth  - Fetal nonstress test  2. Supervision of high-risk pregnancy, third trimester Routine care. GBS needed nv Pt told to RICE left wrist.   3. Maternal morbid obesity, antepartum, unspecified trimester (HCC) See above.   4. Previous cesarean delivery, antepartum Desires tolac; d/w pt already done and consent signed. Pt states 09/2010 c-section at 2cm after AROM by OB. Pt states she was sectioned b/c she didn't dilate. Infant was 7lbs 2oz   Preterm labor symptoms and general obstetric precautions including but not limited to vaginal bleeding, contractions, leaking of fluid and fetal movement were reviewed in detail with the patient. Please refer to After Visit Summary for other counseling recommendations.  Return in about 1 week (around 02/08/2016) for as scheduled.   Clementon Bingharlie Jatin Naumann, MD

## 2016-02-01 NOTE — Progress Notes (Signed)
Pt states she fell 2 days ago - did not seek treatment or evaluation. She c/o pain and swelling @ Lt wrist. She reports good FM since the fall and BPP performed today as scheduled.

## 2016-02-08 ENCOUNTER — Other Ambulatory Visit: Payer: Self-pay | Admitting: Obstetrics & Gynecology

## 2016-02-08 ENCOUNTER — Ambulatory Visit (INDEPENDENT_AMBULATORY_CARE_PROVIDER_SITE_OTHER): Payer: BLUE CROSS/BLUE SHIELD | Admitting: Obstetrics & Gynecology

## 2016-02-08 ENCOUNTER — Ambulatory Visit (HOSPITAL_COMMUNITY)
Admission: RE | Admit: 2016-02-08 | Discharge: 2016-02-08 | Disposition: A | Discharge status: Home / Self Care | Payer: BLUE CROSS/BLUE SHIELD | Source: Ambulatory Visit | Attending: Obstetrics & Gynecology | Admitting: Obstetrics & Gynecology

## 2016-02-08 VITALS — BP 120/63 | HR 87 | Wt 308.3 lb

## 2016-02-08 DIAGNOSIS — Z3A36 36 weeks gestation of pregnancy: Secondary | ICD-10-CM

## 2016-02-08 DIAGNOSIS — O34219 Maternal care for unspecified type scar from previous cesarean delivery: Secondary | ICD-10-CM | POA: Insufficient documentation

## 2016-02-08 DIAGNOSIS — O10913 Unspecified pre-existing hypertension complicating pregnancy, third trimester: Secondary | ICD-10-CM

## 2016-02-08 DIAGNOSIS — O10013 Pre-existing essential hypertension complicating pregnancy, third trimester: Secondary | ICD-10-CM | POA: Diagnosis not present

## 2016-02-08 DIAGNOSIS — O99213 Obesity complicating pregnancy, third trimester: Secondary | ICD-10-CM | POA: Diagnosis not present

## 2016-02-08 DIAGNOSIS — O0993 Supervision of high risk pregnancy, unspecified, third trimester: Secondary | ICD-10-CM

## 2016-02-08 DIAGNOSIS — O99210 Obesity complicating pregnancy, unspecified trimester: Secondary | ICD-10-CM

## 2016-02-08 LAB — POCT URINALYSIS DIP (DEVICE)
Bilirubin Urine: NEGATIVE
GLUCOSE, UA: NEGATIVE mg/dL
Hgb urine dipstick: NEGATIVE
KETONES UR: 15 mg/dL — AB
LEUKOCYTES UA: NEGATIVE
Nitrite: NEGATIVE
Protein, ur: NEGATIVE mg/dL
SPECIFIC GRAVITY, URINE: 1.02 (ref 1.005–1.030)
Urobilinogen, UA: 1 mg/dL (ref 0.0–1.0)
pH: 6 (ref 5.0–8.0)

## 2016-02-08 LAB — OB RESULTS CONSOLE GBS: GBS: POSITIVE

## 2016-02-08 LAB — OB RESULTS CONSOLE GC/CHLAMYDIA: Gonorrhea: NEGATIVE

## 2016-02-08 NOTE — Patient Instructions (Signed)

## 2016-02-08 NOTE — Progress Notes (Signed)
Subjective:  Sierra [name] is a 25 y.o. G5P1031 at 6775w1d being seen today for ongoing prenatal care.  She is currently monitored for the following issues for this high-risk pregnancy and has Supervision of high-risk pregnancy; Previous cesarean delivery, antepartum; Maternal morbid obesity, antepartum (HCC); Preexisting hypertension complicating pregnancy, antepartum; and Recurrent UTI (urinary tract infection) complicating pregnancy on her problem list.  Patient reports no complaints.  Contractions: Not present. Vag. Bleeding: None.  Movement: Present. Denies leaking of fluid.   The following portions of the patient's history were reviewed and updated as appropriate: allergies, current medications, past family history, past medical history, past social history, past surgical history and problem list. Problem list updated.  Objective:   Filed Vitals:   02/08/16 1123  BP: 120/63  Pulse: 87  Weight: 308 lb 4.8 oz (139.844 kg)    Fetal Status: Fetal Heart Rate (bpm): NST Fundal Height: 35 cm Movement: Present     General:  Alert, oriented and cooperative. Patient is in no acute distress.  Skin: Skin is warm and dry. No rash noted.   Cardiovascular: Normal heart rate noted  Respiratory: Normal respiratory effort, no problems with respiration noted  Abdomen: Soft, gravid, appropriate for gestational age. Pain/Pressure: Present     Pelvic: Cervical exam performed Dilation: Closed Effacement (%): Thick Station: Ballotable  Extremities: Normal range of motion.  Edema: None  Mental Status: Normal mood and affect. Normal behavior. Normal judgment and thought content.   Urinalysis: Urine Protein: Negative Urine Glucose: Negative  Assessment and Plan:  Pregnancy: G5P1031 at 6875w1d  1. Preexisting hypertension complicating pregnancy, antepartum, third trimester BPP 8/8 NST reactive - Fetal nonstress test - Culture, beta strep (group b only) - GC/Chlamydia probe amp ([facility])not at  [facility]  2. Supervision of high-risk pregnancy, third trimester   Preterm labor symptoms and general obstetric precautions including but not limited to vaginal bleeding, contractions, leaking of fluid and fetal movement were reviewed in detail with the patient. Please refer to After Visit Summary for other counseling recommendations.  Return in about 7 days (around 02/15/2016) for as scheduled.   Adam PhenixJames G Arnold, MD

## 2016-02-08 NOTE — Progress Notes (Signed)
BPP done today - Weekly NST/BPP due to travel distance.  Next growth [place] scheduled 6/29

## 2016-02-09 LAB — GC/CHLAMYDIA PROBE AMP (~~LOC~~) NOT AT ARMC
Chlamydia: NEGATIVE
Neisseria Gonorrhea: NEGATIVE

## 2016-02-10 LAB — CULTURE, BETA STREP (GROUP B ONLY)

## 2016-02-15 ENCOUNTER — Ambulatory Visit (HOSPITAL_COMMUNITY)
Admission: RE | Admit: 2016-02-15 | Discharge: 2016-02-15 | Disposition: A | Discharge status: Home / Self Care | Payer: BLUE CROSS/BLUE SHIELD | Source: Ambulatory Visit | Attending: Obstetrics & Gynecology | Admitting: Obstetrics & Gynecology

## 2016-02-15 ENCOUNTER — Other Ambulatory Visit: Payer: BLUE CROSS/BLUE SHIELD | Admitting: Obstetrics & Gynecology

## 2016-02-15 DIAGNOSIS — O99213 Obesity complicating pregnancy, third trimester: Secondary | ICD-10-CM | POA: Insufficient documentation

## 2016-02-15 DIAGNOSIS — O10913 Unspecified pre-existing hypertension complicating pregnancy, third trimester: Secondary | ICD-10-CM

## 2016-02-15 DIAGNOSIS — Z3A37 37 weeks gestation of pregnancy: Secondary | ICD-10-CM | POA: Insufficient documentation

## 2016-02-15 DIAGNOSIS — O10013 Pre-existing essential hypertension complicating pregnancy, third trimester: Secondary | ICD-10-CM | POA: Insufficient documentation

## 2016-02-15 DIAGNOSIS — O34219 Maternal care for unspecified type scar from previous cesarean delivery: Secondary | ICD-10-CM | POA: Insufficient documentation

## 2016-02-22 ENCOUNTER — Ambulatory Visit (HOSPITAL_COMMUNITY)
Admission: RE | Admit: 2016-02-22 | Discharge: 2016-02-22 | Disposition: A | Discharge status: Home / Self Care | Payer: BLUE CROSS/BLUE SHIELD | Source: Ambulatory Visit | Attending: Obstetrics & Gynecology | Admitting: Obstetrics & Gynecology

## 2016-02-22 ENCOUNTER — Encounter (HOSPITAL_COMMUNITY): Payer: Self-pay

## 2016-02-22 ENCOUNTER — Ambulatory Visit (INDEPENDENT_AMBULATORY_CARE_PROVIDER_SITE_OTHER): Payer: BLUE CROSS/BLUE SHIELD | Admitting: Obstetrics & Gynecology

## 2016-02-22 ENCOUNTER — Other Ambulatory Visit: Payer: Self-pay | Admitting: Obstetrics & Gynecology

## 2016-02-22 VITALS — BP 114/75 | HR 87 | Wt 313.0 lb

## 2016-02-22 DIAGNOSIS — O10913 Unspecified pre-existing hypertension complicating pregnancy, third trimester: Secondary | ICD-10-CM

## 2016-02-22 DIAGNOSIS — Z22330 Carrier of Group B streptococcus: Secondary | ICD-10-CM

## 2016-02-22 DIAGNOSIS — Z3A38 38 weeks gestation of pregnancy: Secondary | ICD-10-CM | POA: Diagnosis not present

## 2016-02-22 DIAGNOSIS — O34219 Maternal care for unspecified type scar from previous cesarean delivery: Secondary | ICD-10-CM | POA: Insufficient documentation

## 2016-02-22 DIAGNOSIS — O10013 Pre-existing essential hypertension complicating pregnancy, third trimester: Secondary | ICD-10-CM | POA: Diagnosis not present

## 2016-02-22 DIAGNOSIS — O99213 Obesity complicating pregnancy, third trimester: Secondary | ICD-10-CM | POA: Diagnosis not present

## 2016-02-22 DIAGNOSIS — O99820 Streptococcus B carrier state complicating pregnancy: Secondary | ICD-10-CM

## 2016-02-22 DIAGNOSIS — O0993 Supervision of high risk pregnancy, unspecified, third trimester: Secondary | ICD-10-CM

## 2016-02-22 LAB — POCT URINALYSIS DIP (DEVICE)
BILIRUBIN URINE: NEGATIVE
Glucose, UA: NEGATIVE mg/dL
Hgb urine dipstick: NEGATIVE
KETONES UR: NEGATIVE mg/dL
LEUKOCYTES UA: NEGATIVE
NITRITE: NEGATIVE
Protein, ur: NEGATIVE mg/dL
Specific Gravity, Urine: 1.015 (ref 1.005–1.030)
Urobilinogen, UA: 2 mg/dL — ABNORMAL HIGH (ref 0.0–1.0)
pH: 6 (ref 5.0–8.0)

## 2016-02-22 NOTE — Patient Instructions (Signed)
Return to clinic for any scheduled appointments or obstetric concerns, or go to [facility] for evaluation  

## 2016-02-22 NOTE — Progress Notes (Signed)
Subjective:  Sierra [name] is a 25 y.o. G5P1031 at 5974w1d being seen today for ongoing prenatal care.  She is currently monitored for the following issues for this high-risk pregnancy and has Supervision of high-risk pregnancy; Previous cesarean delivery, antepartum; Maternal morbid obesity, antepartum (HCC); Preexisting hypertension complicating pregnancy, antepartum; Recurrent UTI (urinary tract infection) complicating pregnancy; and Group B Streptococcus carrier, +RV culture, currently pregnant on her problem list.  Patient reports no complaints.  Contractions: Irregular. Vag. Bleeding: None.  Movement: Present. Denies leaking of fluid.   The following portions of the patient's history were reviewed and updated as appropriate: allergies, current medications, past family history, past medical history, past social history, past surgical history and problem list. Problem list updated.  Objective:   Filed Vitals:   02/22/16 1132  BP: 114/75  Pulse: 87  Weight: 313 lb (141.976 kg)    Fetal Status: Fetal Heart Rate (bpm): NST Fundal Height: 39 cm Movement: Present     General:  Alert, oriented and cooperative. Patient is in no acute distress.  Skin: Skin is warm and dry. No rash noted.   Cardiovascular: Normal heart rate noted  Respiratory: Normal respiratory effort, no problems with respiration noted  Abdomen: Soft, gravid, appropriate for gestational age. Pain/Pressure: Present     Pelvic: Cervical exam deferred        Extremities: Normal range of motion.  Edema: Trace  Mental Status: Normal mood and affect. Normal behavior. Normal judgment and thought content.   Urinalysis: Urine Protein: Negative Urine Glucose: Negative Ultrasound 6874w1d EFW 7 lb 11 oz /83 &, AC 97%, BPP 10/10 (RNST) Assessment and Plan:  Pregnancy: G5P1031 at 274w1d  1. Preexisting hypertension complicating pregnancy, antepartum, third trimester Stable BP.  IOL at 40 weeks (no meds). NST performed today was  reviewed and was found to be reactive.  Continue recommended antenatal testing and prenatal care.  2. Group B Streptococcus carrier, +RV culture, currently pregnant Diagnosis discussed with patient.  Need for intrapartum prophylaxis.   3. Supervision of high-risk pregnancy, third trimester Term labor symptoms and general obstetric precautions including but not limited to vaginal bleeding, contractions, leaking of fluid and fetal movement were reviewed in detail with the patient. Please refer to After Visit Summary for other counseling recommendations.  Return in about 1 week (around 02/29/2016) for OB Visit, NST, AFI.   Tereso NewcomerUgonna A Carle Fenech, MD

## 2016-02-22 NOTE — Progress Notes (Signed)
Scheduled for IOL 03/06/16 0730.

## 2016-02-29 ENCOUNTER — Ambulatory Visit (INDEPENDENT_AMBULATORY_CARE_PROVIDER_SITE_OTHER): Payer: BLUE CROSS/BLUE SHIELD | Admitting: Obstetrics & Gynecology

## 2016-02-29 ENCOUNTER — Encounter (HOSPITAL_COMMUNITY): Payer: Self-pay | Admitting: *Deleted

## 2016-02-29 ENCOUNTER — Telehealth (HOSPITAL_COMMUNITY): Payer: Self-pay | Admitting: *Deleted

## 2016-02-29 VITALS — BP 97/60 | HR 94 | Wt 315.2 lb

## 2016-02-29 DIAGNOSIS — O99213 Obesity complicating pregnancy, third trimester: Secondary | ICD-10-CM | POA: Diagnosis not present

## 2016-02-29 DIAGNOSIS — O10913 Unspecified pre-existing hypertension complicating pregnancy, third trimester: Secondary | ICD-10-CM

## 2016-02-29 DIAGNOSIS — E669 Obesity, unspecified: Secondary | ICD-10-CM | POA: Diagnosis not present

## 2016-02-29 DIAGNOSIS — O0993 Supervision of high risk pregnancy, unspecified, third trimester: Secondary | ICD-10-CM

## 2016-02-29 LAB — POCT URINALYSIS DIP (DEVICE)
Bilirubin Urine: NEGATIVE
Glucose, UA: NEGATIVE mg/dL
HGB URINE DIPSTICK: NEGATIVE
Ketones, ur: NEGATIVE mg/dL
Leukocytes, UA: NEGATIVE
NITRITE: NEGATIVE
PH: 5.5 (ref 5.0–8.0)
PROTEIN: NEGATIVE mg/dL
SPECIFIC GRAVITY, URINE: 1.02 (ref 1.005–1.030)
UROBILINOGEN UA: 1 mg/dL (ref 0.0–1.0)

## 2016-02-29 NOTE — Telephone Encounter (Signed)
Preadmission screen  

## 2016-02-29 NOTE — Progress Notes (Signed)
[place] last week 6/29 5563w1d EFW 7 lb 11 oz /83 &, AC 97%   Subjective:  Sierra [name] is a 25 y.o. G5P1031 at 7119w1d being seen today for ongoing prenatal care.  She is currently monitored for the following issues for this high-risk pregnancy and has Supervision of high-risk pregnancy; Previous cesarean delivery, antepartum; Maternal morbid obesity, antepartum (HCC); Preexisting hypertension complicating pregnancy, antepartum; Recurrent UTI (urinary tract infection) complicating pregnancy; and Group B Streptococcus carrier, +RV culture, currently pregnant on her problem list.  Patient reports no complaints.  Contractions: Irregular. Vag. Bleeding: None.  Movement: Present. Denies leaking of fluid.   The following portions of the patient's history were reviewed and updated as appropriate: allergies, current medications, past family history, past medical history, past social history, past surgical history and problem list. Problem list updated.  Objective:   Filed Vitals:   02/29/16 0821  BP: 97/60  Pulse: 94  Weight: 315 lb 3.2 oz (142.974 kg)    Fetal Status: Fetal Heart Rate (bpm): NST   Movement: Present     General:  Alert, oriented and cooperative. Patient is in no acute distress.  Skin: Skin is warm and dry. No rash noted.   Cardiovascular: Normal heart rate noted  Respiratory: Normal respiratory effort, no problems with respiration noted  Abdomen: Soft, gravid, appropriate for gestational age. Pain/Pressure: Present     Pelvic:  Cervical exam deferred        Extremities: Normal range of motion.  Edema: Mild pitting, slight indentation  Mental Status: Normal mood and affect. Normal behavior. Normal judgment and thought content.   Urinalysis: Urine Protein: Negative Urine Glucose: Negative  Assessment and Plan:  Pregnancy: G5P1031 at 5419w1d  1. Preexisting hypertension complicating pregnancy, antepartum, third trimester normotensive  2. Supervision of high-risk pregnancy, third  trimester Normal growth by [place]  Term labor symptoms and general obstetric precautions including but not limited to vaginal bleeding, contractions, leaking of fluid and fetal movement were reviewed in detail with the patient. Please refer to After Visit Summary for other counseling recommendations.  Return in about 4 days (around 03/04/2016).   Adam PhenixJames G Milyn Stapleton, MD

## 2016-02-29 NOTE — Patient Instructions (Signed)
Labor Induction Labor induction is when steps are taken to cause a pregnant woman to begin the labor process. Most women go into labor on their own between 37 weeks and 42 weeks of the pregnancy. When this does not happen or when there is a medical need, methods may be used to induce labor. Labor induction causes a pregnant woman's uterus to contract. It also causes the cervix to soften (ripen), open (dilate), and thin out (efface). Usually, labor is not induced before 39 weeks of the pregnancy unless there is a problem with the baby or mother.  Before inducing labor, your health care provider will consider a number of factors, including the following:  The medical condition of you and the baby.   How many weeks along you are.   The status of the baby's lung maturity.   The condition of the cervix.   The position of the baby.  WHAT ARE THE REASONS FOR LABOR INDUCTION? Labor may be induced for the following reasons:  The health of the baby or mother is at risk.   The pregnancy is overdue by 1 week or more.   The water breaks but labor does not start on its own.   The mother has a health condition or serious illness, such as high blood pressure, infection, placental abruption, or diabetes.  The amniotic fluid amounts are low around the baby.   The baby is distressed.  Convenience or wanting the baby to be born on a certain date is not a reason for inducing labor. WHAT METHODS ARE USED FOR LABOR INDUCTION? Several methods of labor induction may be used, such as:   Prostaglandin medicine. This medicine causes the cervix to dilate and ripen. The medicine will also start contractions. It can be taken by mouth or by inserting a suppository into the vagina.   Inserting a thin tube (catheter) with a balloon on the end into the vagina to dilate the cervix. Once inserted, the balloon is expanded with water, which causes the cervix to open.   Stripping the membranes. Your health  care provider separates amniotic sac tissue from the cervix, causing the cervix to be stretched and causing the release of a hormone called progesterone. This may cause the uterus to contract. It is often done during an office visit. You will be sent home to wait for the contractions to begin. You will then come in for an induction.   Breaking the water. Your health care provider makes a hole in the amniotic sac using a small instrument. Once the amniotic sac breaks, contractions should begin. This may still take hours to see an effect.   Medicine to trigger or strengthen contractions. This medicine is given through an IV access tube inserted into a vein in your arm.  All of the methods of induction, besides stripping the membranes, will be done in the hospital. Induction is done in the hospital so that you and the baby can be carefully monitored.  HOW LONG DOES IT TAKE FOR LABOR TO BE INDUCED? Some inductions can take up to 2-3 days. Depending on the cervix, it usually takes less time. It takes longer when you are induced early in the pregnancy or if this is your first pregnancy. If a mother is still pregnant and the induction has been going on for 2-3 days, either the mother will be sent home or a cesarean delivery will be needed. WHAT ARE THE RISKS ASSOCIATED WITH LABOR INDUCTION? Some of the risks of induction include:     Changes in fetal heart rate, such as too high, too low, or erratic.   Fetal distress.   Chance of infection for the mother and baby.   Increased chance of having a cesarean delivery.   Breaking off (abruption) of the placenta from the uterus (rare).   Uterine rupture (very rare).  When induction is needed for medical reasons, the benefits of induction may outweigh the risks. WHAT ARE SOME REASONS FOR NOT INDUCING LABOR? Labor induction should not be done if:   It is shown that your baby does not tolerate labor.   You have had previous surgeries on your  uterus, such as a myomectomy or the removal of fibroids.   Your placenta lies very low in the uterus and blocks the opening of the cervix (placenta previa).   Your baby is not in a head-down position.   The umbilical cord drops down into the birth canal in front of the baby. This could cut off the baby's blood and oxygen supply.   You have had a previous cesarean delivery.   There are unusual circumstances, such as the baby being extremely premature.    This information is not intended to replace advice given to you by your health care provider. Make sure you discuss any questions you have with your health care provider.   Document Released: 01/01/2007 Document Revised: 09/02/2014 Document Reviewed: 03/11/2013 Elsevier Interactive Patient Education 2016 Elsevier Inc.  

## 2016-03-06 ENCOUNTER — Encounter (HOSPITAL_COMMUNITY): Payer: Self-pay

## 2016-03-06 ENCOUNTER — Inpatient Hospital Stay (HOSPITAL_COMMUNITY)
Admission: RE | Admit: 2016-03-06 | Discharge: 2016-03-09 | DRG: 765 | Disposition: A | Discharge status: Home / Self Care | Payer: BLUE CROSS/BLUE SHIELD | Source: Ambulatory Visit | Attending: Obstetrics & Gynecology | Admitting: Obstetrics & Gynecology

## 2016-03-06 VITALS — BP 114/70 | HR 71 | Temp 98.0°F | Resp 20 | Ht 62.0 in | Wt 313.0 lb

## 2016-03-06 DIAGNOSIS — Z302 Encounter for sterilization: Secondary | ICD-10-CM | POA: Diagnosis not present

## 2016-03-06 DIAGNOSIS — O99214 Obesity complicating childbirth: Secondary | ICD-10-CM | POA: Diagnosis present

## 2016-03-06 DIAGNOSIS — O99820 Streptococcus B carrier state complicating pregnancy: Secondary | ICD-10-CM

## 2016-03-06 DIAGNOSIS — Z3A40 40 weeks gestation of pregnancy: Secondary | ICD-10-CM | POA: Diagnosis not present

## 2016-03-06 DIAGNOSIS — O99824 Streptococcus B carrier state complicating childbirth: Secondary | ICD-10-CM | POA: Diagnosis present

## 2016-03-06 DIAGNOSIS — Z833 Family history of diabetes mellitus: Secondary | ICD-10-CM

## 2016-03-06 DIAGNOSIS — O34211 Maternal care for low transverse scar from previous cesarean delivery: Secondary | ICD-10-CM | POA: Diagnosis present

## 2016-03-06 DIAGNOSIS — O1002 Pre-existing essential hypertension complicating childbirth: Secondary | ICD-10-CM | POA: Diagnosis present

## 2016-03-06 DIAGNOSIS — Z8249 Family history of ischemic heart disease and other diseases of the circulatory system: Secondary | ICD-10-CM

## 2016-03-06 DIAGNOSIS — Z87440 Personal history of urinary (tract) infections: Secondary | ICD-10-CM | POA: Diagnosis not present

## 2016-03-06 DIAGNOSIS — O34219 Maternal care for unspecified type scar from previous cesarean delivery: Secondary | ICD-10-CM

## 2016-03-06 DIAGNOSIS — Z6841 Body Mass Index (BMI) 40.0 and over, adult: Secondary | ICD-10-CM

## 2016-03-06 DIAGNOSIS — O1092 Unspecified pre-existing hypertension complicating childbirth: Secondary | ICD-10-CM | POA: Diagnosis not present

## 2016-03-06 DIAGNOSIS — Z8349 Family history of other endocrine, nutritional and metabolic diseases: Secondary | ICD-10-CM

## 2016-03-06 DIAGNOSIS — O10919 Unspecified pre-existing hypertension complicating pregnancy, unspecified trimester: Secondary | ICD-10-CM | POA: Diagnosis present

## 2016-03-06 DIAGNOSIS — Z841 Family history of disorders of kidney and ureter: Secondary | ICD-10-CM

## 2016-03-06 DIAGNOSIS — O99210 Obesity complicating pregnancy, unspecified trimester: Secondary | ICD-10-CM

## 2016-03-06 DIAGNOSIS — O0993 Supervision of high risk pregnancy, unspecified, third trimester: Secondary | ICD-10-CM

## 2016-03-06 DIAGNOSIS — O10913 Unspecified pre-existing hypertension complicating pregnancy, third trimester: Secondary | ICD-10-CM

## 2016-03-06 DIAGNOSIS — Z98891 History of uterine scar from previous surgery: Secondary | ICD-10-CM

## 2016-03-06 HISTORY — DX: Unspecified pre-existing hypertension complicating pregnancy, unspecified trimester: O10.919

## 2016-03-06 LAB — CBC
HEMATOCRIT: 31.8 % — AB (ref 36.0–46.0)
HEMOGLOBIN: 10.4 g/dL — AB (ref 12.0–15.0)
MCH: 22.6 pg — ABNORMAL LOW (ref 26.0–34.0)
MCHC: 32.7 g/dL (ref 30.0–36.0)
MCV: 69 fL — AB (ref 78.0–100.0)
Platelets: 248 10*3/uL (ref 150–400)
RBC: 4.61 MIL/uL (ref 3.87–5.11)
RDW: 17.6 % — ABNORMAL HIGH (ref 11.5–15.5)
WBC: 9.2 10*3/uL (ref 4.0–10.5)

## 2016-03-06 LAB — TYPE AND SCREEN
ABO/RH(D): A POS
Antibody Screen: NEGATIVE

## 2016-03-06 LAB — RPR: RPR Ser Ql: NONREACTIVE

## 2016-03-06 LAB — ABO/RH: ABO/RH(D): A POS

## 2016-03-06 MED ORDER — SOD CITRATE-CITRIC ACID 500-334 MG/5ML PO SOLN
30.0000 mL | ORAL | Status: DC | PRN
  Administered 2016-03-07 (×2): 30 mL via ORAL
  Filled 2016-03-06: qty 15

## 2016-03-06 MED ORDER — OXYTOCIN 40 UNITS IN LACTATED RINGERS INFUSION - SIMPLE MED
1.0000 m[IU]/min | INTRAVENOUS | Status: DC
  Administered 2016-03-06: 2 m[IU]/min via INTRAVENOUS

## 2016-03-06 MED ORDER — PENICILLIN G POTASSIUM 5000000 UNITS IJ SOLR
2.5000 10*6.[IU] | INTRAVENOUS | Status: DC
  Administered 2016-03-06 – 2016-03-07 (×8): 2.5 10*6.[IU] via INTRAVENOUS
  Filled 2016-03-06 (×10): qty 2.5

## 2016-03-06 MED ORDER — OXYTOCIN BOLUS FROM INFUSION: 500.0000 mL | INTRAVENOUS | Status: DC

## 2016-03-06 MED ORDER — OXYTOCIN 40 UNITS IN LACTATED RINGERS INFUSION - SIMPLE MED
2.5000 [IU]/h | INTRAVENOUS | Status: DC
  Filled 2016-03-06: qty 1000

## 2016-03-06 MED ORDER — LACTATED RINGERS IV SOLN
INTRAVENOUS | Status: DC
  Administered 2016-03-06 (×2): via INTRAVENOUS

## 2016-03-06 MED ORDER — LACTATED RINGERS IV SOLN: 500.0000 mL | INTRAVENOUS | Status: DC | PRN

## 2016-03-06 MED ORDER — ACETAMINOPHEN 325 MG PO TABS: 650.0000 mg | ORAL_TABLET | ORAL | Status: DC | PRN

## 2016-03-06 MED ORDER — LIDOCAINE HCL (PF) 1 % IJ SOLN: 30.0000 mL | INTRAMUSCULAR | Status: DC | PRN

## 2016-03-06 MED ORDER — DEXTROSE 5 % IV SOLN
5.0000 10*6.[IU] | Freq: Once | INTRAVENOUS | Status: AC
  Administered 2016-03-06: 5 10*6.[IU] via INTRAVENOUS
  Filled 2016-03-06: qty 5

## 2016-03-06 MED ORDER — ONDANSETRON HCL 4 MG/2ML IJ SOLN
4.0000 mg | Freq: Four times a day (QID) | INTRAMUSCULAR | Status: DC | PRN
  Administered 2016-03-06: 4 mg via INTRAVENOUS
  Filled 2016-03-06: qty 2

## 2016-03-06 NOTE — Progress Notes (Signed)
Okay per Philipp DeputyKim  Shaw, cnm for pt to ambulate without monitors.

## 2016-03-06 NOTE — Anesthesia Pain Management Evaluation Note (Signed)
  CRNA Pain Management Visit Note  Patient: Sierra [name], 25 y.o., female  "Hello I am a member of the anesthesia team at [facility]. We have an anesthesia team available at all times to provide care throughout the hospital, including epidural management and anesthesia for C-section. I don't know your plan for the delivery whether it a natural birth, water birth, IV sedation, nitrous supplementation, doula or epidural, but we want to meet your pain goals."   1.Was your pain managed to your expectations on prior hospitalizations?   Yes   2.What is your expectation for pain management during this hospitalization?     Epidural and IV pain meds  3.How can we help you reach that goal? Be available when needed  Record the patient's initial score and the patient's pain goal.   Pain: 0  Pain Goal: 4 [facility] wants you to be able to say your pain was always managed very well.  Gowri Suchan 03/06/2016

## 2016-03-06 NOTE — H&P (Signed)
LABOR AND DELIVERY ADMISSION HISTORY AND PHYSICAL NOTE  Sierra [name] is a 25 y.o. female [phone] with IUP at 1030w0d by US presenting for IOL for cHTN, TOLAC.   She reports positive fetal movement. She denies leakage of fluid or vaginal bleeding.  Clinic  [place] Prenatal Labs  Dating U/S 7 weeks Blood type: A/POS/-- (02/02 1059)   Genetic Screen 1 Screen:   NT could not obtain     Quad: neg Antibody:NEG (02/02 1059)  Anatomic US Limited views of spine & arches at 20 weeks [X]  rescan on 12/13/15 > nml Rubella: 4.45 (02/02 1059)  GTT Early:  99             Third trimester: 127 RPR: NON REAC (02/02 1059)   Flu vaccine  3/2 HBsAg: NEGATIVE (02/02 1059)   TDaP vaccine  12/14/15                               HIV: NONREACTIVE (02/02 1059)   Baby Food  Considering breastfeeding                                         AVW:[id number]:Positive  Contraception  Mirena Pap: negative  Circumcision  Desires inpatient   Pediatrician  [place]   Support Person  Ethelene Brownsnthony (husband)     Prenatal History/Complications:  Past Medical History: Past Medical History  Diagnosis Date  . Hypertension   . Vaginal Pap smear, abnormal     Past Surgical History: Past Surgical History  Procedure Laterality Date  . Cesarean section      Obstetrical History: OB History    Gravida Para Term Preterm AB TAB SAB Ectopic Multiple Living   5 1 1  0 3 0 1 0 0 1      Social History: Social History   Social History  . Marital Status: Married    Spouse Name: N/A  . Number of Children: N/A  . Years of Education: N/A   Social History Main Topics  . Smoking status: Never Smoker   . Smokeless tobacco: Never Used  . Alcohol Use: No  . Drug Use: No  . Sexual Activity: Yes    Birth Control/ Protection: None   Other Topics Concern  . None   Social History Narrative    Family History: Family History  Problem Relation Age of Onset  . Hypertension Mother   . Kidney  disease Father   . Diabetes Father   . High Cholesterol Father   . Autoimmune disease Father   . Cancer Paternal Grandmother     ovarian    Allergies: Allergies  Allergen Reactions  . Benadryl [Diphenhydramine] Other (See Comments)    Throat swells  . Demerol [Meperidine] Hives  . Morphine And Related Itching and Swelling    Itchy throat and swelling    Prescriptions prior to admission  Medication Sig Dispense Refill Last Dose  . Prenatal Multivit-Min-Fe-FA (PRENATAL VITAMINS PO) Take 1 tablet by mouth daily.   03/05/2016 at Unknown time  . aspirin EC 81 MG tablet Take 1 tablet (81 mg total) by mouth daily. Take after 12 weeks for prevention of preeclampsia later in pregnancy (Patient not taking: Reported on 02/22/2016) 300 tablet 2 Not Taking     Review of Systems   All systems reviewed and negative  except as stated in HPI  Height  (1.575 m), weight 141.976 kg (313 lb), last menstrual period 06/05/2015. General appearance: alert, cooperative and no distress Lungs: clear to auscultation bilaterally Heart: regular rate and rhythm Abdomen: soft, non-tender; bowel sounds normal Extremities: No calf swelling or tenderness Presentation: cephalic by bedside US Fetal monitoring: cat 1 Uterine activity: no significant contractions      Prenatal labs: ABO, Rh: --/--/A POS (07/12 0820) Antibody: PENDING (07/12 0820) Rubella: !Error! RPR: NON REAC (04/20 1005)  HBsAg: NEGATIVE (02/02 1059)  HIV: NONREACTIVE (04/20 1005)  GBS:   Pos 1 hr Glucola: passed Genetic screening:  normal Anatomy US: normal female  Prenatal Transfer Tool  Maternal Diabetes: No Genetic Screening: Normal Maternal Ultrasounds/Referrals: Normal Fetal Ultrasounds or other Referrals:  None Maternal Substance Abuse:  No Significant Maternal Medications:  None Significant Maternal Lab Results: None  Results for orders placed or performed during the hospital encounter of 03/06/16 (from the past 24  hour(s))  CBC   Collection Time: 03/06/16  8:20 AM  Result Value Ref Range   WBC 9.2 4.0 - 10.5 K/uL   RBC 4.61 3.87 - 5.11 MIL/uL   Hemoglobin 10.4 (L) 12.0 - 15.0 g/dL   HCT 09.8 (L) 11.9 - 14.7 %   MCV 69.0 (L) 78.0 - 100.0 fL   MCH 22.6 (L) 26.0 - 34.0 pg   MCHC 32.7 30.0 - 36.0 g/dL   RDW 82.9 (H) 56.2 - 13.0 %   Platelets 248 150 - 400 K/uL  Type and screen   Collection Time: 03/06/16  8:20 AM  Result Value Ref Range   ABO/RH(D) A POS    Antibody Screen PENDING    Sample Expiration 03/09/2016     Patient Active Problem List   Diagnosis Date Noted  . Chronic hypertension in pregnancy 03/06/2016  . Group B Streptococcus carrier, +RV culture, currently pregnant 02/22/2016  . Recurrent UTI (urinary tract infection) complicating pregnancy 10/26/2015  . Preexisting hypertension complicating pregnancy, antepartum 09/28/2015  . Supervision of high-risk pregnancy 08/03/2015  . Previous cesarean delivery, antepartum 08/03/2015  . Maternal morbid obesity, antepartum (HCC) 08/03/2015    Assessment: Sierra [name] is a 25 y.o. G5P1031 at [redacted]w[redacted]d here for IOL for cHTN and TOLAC.  #Labor: s/p foley at 0930 #Pain: IV meds #FWB: Cat 1 #ID:  GBS pos - PCN #MOF: bottle #MOC: BTL?  #Circ:  inpatient  Loni Muse 03/06/2016, 9:16 AM   CNM attestation:  I have seen and examined this patient; I agree with above documentation in the resident's note.   Sierra [name] is a 25 y.o. [phone] here for IOL due to [place]; pt states w/ prev C/S she feels like she didn't get a chance to get into active labor- was sectioned at <4cm without fetal distress (per pt)  PE: Ht  (1.575 m)  Wt 141.976 kg (313 lb)  BMI 57.23 kg/m2  LMP 06/05/2015  Vitals: 110/65, 86, 18, T-nl Gen: calm comfortable, NAD Resp: normal effort, no distress Abd: gravid  ROS, labs, PMH reviewed  Plan: IUP@term  cHTN- BP stable Prev C/S- for TOLAC  Admit to [facility] Suites Foley placed for cx  ripening Anticipate SVD  Marx Doig CNM 03/06/2016, 11:40 AM

## 2016-03-07 ENCOUNTER — Inpatient Hospital Stay (HOSPITAL_COMMUNITY): Payer: BLUE CROSS/BLUE SHIELD | Admitting: Anesthesiology

## 2016-03-07 ENCOUNTER — Encounter (HOSPITAL_COMMUNITY): Payer: Self-pay

## 2016-03-07 ENCOUNTER — Encounter (HOSPITAL_COMMUNITY)
Admission: RE | Disposition: A | Discharge status: Home / Self Care | Payer: Self-pay | Source: Ambulatory Visit | Attending: Obstetrics & Gynecology

## 2016-03-07 DIAGNOSIS — O1092 Unspecified pre-existing hypertension complicating childbirth: Secondary | ICD-10-CM

## 2016-03-07 DIAGNOSIS — Z302 Encounter for sterilization: Secondary | ICD-10-CM

## 2016-03-07 DIAGNOSIS — O34219 Maternal care for unspecified type scar from previous cesarean delivery: Secondary | ICD-10-CM

## 2016-03-07 DIAGNOSIS — Z3A40 40 weeks gestation of pregnancy: Secondary | ICD-10-CM

## 2016-03-07 LAB — CBC
HCT: 33.8 % — ABNORMAL LOW (ref 36.0–46.0)
Hemoglobin: 11 g/dL — ABNORMAL LOW (ref 12.0–15.0)
MCH: 22.4 pg — AB (ref 26.0–34.0)
MCHC: 32.5 g/dL (ref 30.0–36.0)
MCV: 68.8 fL — ABNORMAL LOW (ref 78.0–100.0)
PLATELETS: 242 10*3/uL (ref 150–400)
RBC: 4.91 MIL/uL (ref 3.87–5.11)
RDW: 17.6 % — AB (ref 11.5–15.5)
WBC: 14.8 10*3/uL — ABNORMAL HIGH (ref 4.0–10.5)

## 2016-03-07 SURGERY — Surgical Case
Anesthesia: Epidural

## 2016-03-07 MED ORDER — EPHEDRINE 5 MG/ML INJ: 10.0000 mg | INTRAVENOUS | Status: DC | PRN

## 2016-03-07 MED ORDER — FENTANYL 2.5 MCG/ML BUPIVACAINE 1/10 % EPIDURAL INFUSION (WH - ANES)
INTRAMUSCULAR | Status: DC | PRN
  Administered 2016-03-07: 14 mL/h via EPIDURAL

## 2016-03-07 MED ORDER — SCOPOLAMINE 1 MG/3DAYS TD PT72
MEDICATED_PATCH | TRANSDERMAL | Status: DC | PRN
  Administered 2016-03-07: 1 via TRANSDERMAL

## 2016-03-07 MED ORDER — BUPIVACAINE HCL (PF) 0.5 % IJ SOLN
INTRAMUSCULAR | Status: DC | PRN
  Administered 2016-03-07: 30 mL

## 2016-03-07 MED ORDER — DEXTROSE 5 % IV SOLN
3.0000 g | INTRAVENOUS | Status: AC
  Administered 2016-03-07: 3 g via INTRAVENOUS

## 2016-03-07 MED ORDER — ONDANSETRON HCL 4 MG/2ML IJ SOLN
INTRAMUSCULAR | Status: AC
  Filled 2016-03-07: qty 2

## 2016-03-07 MED ORDER — OXYTOCIN 10 UNIT/ML IJ SOLN
INTRAMUSCULAR | Status: AC
  Filled 2016-03-07: qty 1

## 2016-03-07 MED ORDER — ONDANSETRON HCL 4 MG/2ML IJ SOLN
INTRAMUSCULAR | Status: DC | PRN
  Administered 2016-03-07: 4 mg via INTRAVENOUS

## 2016-03-07 MED ORDER — LIDOCAINE-EPINEPHRINE (PF) 2 %-1:200000 IJ SOLN
INTRAMUSCULAR | Status: AC
  Filled 2016-03-07: qty 20

## 2016-03-07 MED ORDER — LACTATED RINGERS IV SOLN: INTRAVENOUS | Status: DC

## 2016-03-07 MED ORDER — NALBUPHINE HCL 10 MG/ML IJ SOLN
10.0000 mg | Freq: Once | INTRAMUSCULAR | Status: AC
  Administered 2016-03-07: 10 mg via INTRAVENOUS
  Filled 2016-03-07: qty 1

## 2016-03-07 MED ORDER — PHENYLEPHRINE HCL 10 MG/ML IJ SOLN
INTRAMUSCULAR | Status: DC | PRN
  Administered 2016-03-07 (×2): 40 ug via INTRAVENOUS

## 2016-03-07 MED ORDER — DEXTROSE 5 % IV SOLN
INTRAVENOUS | Status: AC
  Filled 2016-03-07: qty 3000

## 2016-03-07 MED ORDER — PHENYLEPHRINE 40 MCG/ML (10ML) SYRINGE FOR IV PUSH (FOR BLOOD PRESSURE SUPPORT): 80.0000 ug | PREFILLED_SYRINGE | INTRAVENOUS | Status: DC | PRN

## 2016-03-07 MED ORDER — SODIUM CHLORIDE 0.9 % IV SOLN
14.0000 mL/h | INTRAVENOUS | Status: DC | PRN
  Administered 2016-03-07: 14 mL/h via EPIDURAL
  Filled 2016-03-07 (×4): qty 17

## 2016-03-07 MED ORDER — NALBUPHINE HCL 10 MG/ML IJ SOLN
10.0000 mg | INTRAMUSCULAR | Status: DC | PRN
  Administered 2016-03-08 (×2): 10 mg via INTRAVENOUS
  Filled 2016-03-07: qty 1

## 2016-03-07 MED ORDER — LACTATED RINGERS IV SOLN
INTRAVENOUS | Status: DC | PRN
  Administered 2016-03-07 (×3): via INTRAVENOUS

## 2016-03-07 MED ORDER — LIDOCAINE HCL (PF) 1 % IJ SOLN
INTRAMUSCULAR | Status: DC | PRN
  Administered 2016-03-07 (×2): 7 mL via EPIDURAL

## 2016-03-07 MED ORDER — KETOROLAC TROMETHAMINE 30 MG/ML IJ SOLN
30.0000 mg | Freq: Four times a day (QID) | INTRAMUSCULAR | Status: AC
  Administered 2016-03-08 (×2): 30 mg via INTRAVENOUS
  Filled 2016-03-07 (×2): qty 1

## 2016-03-07 MED ORDER — SCOPOLAMINE 1 MG/3DAYS TD PT72
MEDICATED_PATCH | TRANSDERMAL | Status: AC
  Filled 2016-03-07: qty 1

## 2016-03-07 MED ORDER — BUPIVACAINE HCL (PF) 0.5 % IJ SOLN
INTRAMUSCULAR | Status: AC
  Filled 2016-03-07: qty 30

## 2016-03-07 MED ORDER — OXYTOCIN 10 UNIT/ML IJ SOLN
40.0000 [IU] | INTRAMUSCULAR | Status: DC | PRN
  Administered 2016-03-07: 40 [IU] via INTRAVENOUS

## 2016-03-07 MED ORDER — PHENYLEPHRINE 40 MCG/ML (10ML) SYRINGE FOR IV PUSH (FOR BLOOD PRESSURE SUPPORT)
80.0000 ug | PREFILLED_SYRINGE | INTRAVENOUS | Status: DC | PRN
  Filled 2016-03-07: qty 10

## 2016-03-07 MED ORDER — LIDOCAINE-EPINEPHRINE (PF) 2 %-1:200000 IJ SOLN
INTRAMUSCULAR | Status: DC | PRN
  Administered 2016-03-07: 2 mL via EPIDURAL
  Administered 2016-03-07: 5 mL via EPIDURAL
  Administered 2016-03-07: 3 mL via EPIDURAL
  Administered 2016-03-07: 5 mL via EPIDURAL
  Administered 2016-03-07: 2 mL via EPIDURAL

## 2016-03-07 MED ORDER — LACTATED RINGERS IV SOLN: 500.0000 mL | Freq: Once | INTRAVENOUS | Status: DC

## 2016-03-07 SURGICAL SUPPLY — 31 items
BARRIER ADHS 3X4 INTERCEED (GAUZE/BANDAGES/DRESSINGS) IMPLANT
CHLORAPREP W/TINT 26ML (MISCELLANEOUS) ×3 IMPLANT
CLAMP CORD UMBIL (MISCELLANEOUS) IMPLANT
CLIP FILSHIE TUBAL LIGA STRL (Clip) ×3 IMPLANT
CLOTH BEACON ORANGE TIMEOUT ST (SAFETY) ×3 IMPLANT
DRSG OPSITE POSTOP 4X10 (GAUZE/BANDAGES/DRESSINGS) ×3 IMPLANT
ELECT REM PT RETURN 9FT ADLT (ELECTROSURGICAL) ×3
ELECTRODE REM PT RTRN 9FT ADLT (ELECTROSURGICAL) ×1 IMPLANT
EXTRACTOR VACUUM KIWI (MISCELLANEOUS) IMPLANT
GLOVE BIO SURGEON STRL SZ 6.5 (GLOVE) ×2 IMPLANT
GLOVE BIO SURGEONS STRL SZ 6.5 (GLOVE) ×1
GLOVE BIOGEL PI IND STRL 7.0 (GLOVE) ×2 IMPLANT
GLOVE BIOGEL PI INDICATOR 7.0 (GLOVE) ×4
GOWN STRL REUS W/TWL LRG LVL3 (GOWN DISPOSABLE) ×6 IMPLANT
KIT ABG SYR 3ML LUER SLIP (SYRINGE) IMPLANT
NEEDLE HYPO 22GX1.5 SAFETY (NEEDLE) IMPLANT
NEEDLE HYPO 25X5/8 SAFETYGLIDE (NEEDLE) IMPLANT
NS IRRIG 1000ML POUR BTL (IV SOLUTION) ×3 IMPLANT
PACK C SECTION WH (CUSTOM PROCEDURE TRAY) ×3 IMPLANT
PAD OB MATERNITY 4.3X12.25 (PERSONAL CARE ITEMS) ×3 IMPLANT
PENCIL SMOKE EVAC W/HOLSTER (ELECTROSURGICAL) ×3 IMPLANT
RETRACTOR WND ALEXIS 25 LRG (MISCELLANEOUS) IMPLANT
RTRCTR WOUND ALEXIS 25CM LRG (MISCELLANEOUS)
SUT PLAIN 2 0 XLH (SUTURE) ×3 IMPLANT
SUT VIC AB 0 CT1 36 (SUTURE) ×18 IMPLANT
SUT VIC AB 2-0 CT1 27 (SUTURE) ×2
SUT VIC AB 2-0 CT1 TAPERPNT 27 (SUTURE) ×1 IMPLANT
SUT VIC AB 4-0 PS2 27 (SUTURE) ×3 IMPLANT
SYR CONTROL 10ML LL (SYRINGE) IMPLANT
TOWEL OR 17X24 6PK STRL BLUE (TOWEL DISPOSABLE) ×3 IMPLANT
TRAY FOLEY CATH SILVER 14FR (SET/KITS/TRAYS/PACK) IMPLANT

## 2016-03-07 NOTE — Transfer of Care (Signed)
Immediate Anesthesia Transfer of Care Note  Patient: Sierra [name]  Procedure(s) Performed: Procedure(s): CESAREAN SECTION (N/A)  Patient Location: PACU  Anesthesia Type:Epidural  Level of Consciousness: awake  Airway & Oxygen Therapy: Patient Spontanous Breathing  Post-op Assessment: Report given to RN and Post -op Vital signs reviewed and stable  Post vital signs: stable  Last Vitals:  Filed Vitals:   03/07/16 2101 03/07/16 2131  BP: 103/82 123/78  Pulse: 100 92  Temp:    Resp:      Last Pain:  Filed Vitals:   03/07/16 2138  PainSc: 0-No pain         Complications: No apparent anesthesia complications

## 2016-03-07 NOTE — Progress Notes (Signed)
Sierra [name] is a 25 y.o. G5P1031 at 7493w1d by ultrasound admitted for rupture of membranes  Subjective:   Objective: BP 115/68 mmHg  Pulse 90  Temp(Src) 98.9 F (37.2 C) (Oral)  Resp 18  Ht 5\' 2"  (1.575 m)  Wt 141.976 kg (313 lb)  BMI 57.23 kg/m2  SpO2 99%  LMP 06/05/2015   Total I/O In: -  Out: 1150 [Urine:1150]  FHT:  FHR: 150 bpm, variability: moderate,  accelerations:  Present,  decelerations:  Absent UC:   regular, every 3 minutes SVE:   Dilation: 4 Effacement (%): 70 Station: -2 Exam by:: soliz rn  No change by my exam now  Labs: Lab Results  Component Value Date   WBC 14.8* 03/07/2016   HGB 11.0* 03/07/2016   HCT 33.8* 03/07/2016   MCV 68.8* 03/07/2016   PLT 242 03/07/2016    Assessment / Plan: Arrest of decent  Labor:   Preeclampsia:  no signs or symptoms of toxicity Fetal Wellbeing:  Category I Pain Control:  Epidural I/D:  n/a  Anticipated MOD:  Cesarean section and requests sterilization The risks of cesarean section discussed with the patient included but were not limited to: bleeding which may require transfusion or reoperation; infection which may require antibiotics; injury to bowel, bladder, ureters or other surrounding organs; injury to the fetus; need for additional procedures including hysterectomy in the event of a life-threatening hemorrhage; placental abnormalities wth subsequent pregnancies, incisional problems, thromboembolic phenomenon and other postoperative/anesthesia complications. The patient concurred with the proposed plan, giving informed written consent for the procedure.   Patient has been NPO since 0000 she will remain NPO for procedure. Anesthesia and OR aware. Preoperative prophylactic antibiotics and SCDs ordered on call to the OR.  To OR when ready. desires permanent sterilization. Risks and benefits of procedure discussed with patient including permanence of method, bleeding, infection, injury to surrounding organs and need  for additional procedures. Risk failure of 0.5-1% with increased risk of ectopic gestation if pregnancy occurs was also discussed with patient. Patient verbalized understanding and all questions were answered.  Currie ParisJames G. Debroah LoopArnold MD 03/07/2016 9:00 PM      ARNOLD,JAMES 03/07/2016, 8:57 PM

## 2016-03-07 NOTE — Anesthesia Preprocedure Evaluation (Addendum)
Anesthesia Evaluation  Patient identified by MRN, date of birth, ID band Patient awake    Reviewed: Allergy & Precautions, H&P , NPO status , Patient's Chart, lab work & pertinent test results  Airway Mallampati: III  TM Distance: >3 FB Neck ROM: full    Dental no notable dental hx.    Pulmonary neg pulmonary ROS,    Pulmonary exam normal        Cardiovascular hypertension, Normal cardiovascular exam     Neuro/Psych negative neurological ROS  negative psych ROS   GI/Hepatic negative GI ROS, Neg liver ROS,   Endo/Other  Morbid obesity  Renal/GU negative Renal ROS     Musculoskeletal   Abdominal (+) + obese,   Peds  Hematology negative hematology ROS (+)   Anesthesia Other Findings   Reproductive/Obstetrics (+) Pregnancy                           Anesthesia Physical Anesthesia Plan  ASA: III and emergent  Anesthesia Plan: Epidural   Post-op Pain Management:    Induction:   Airway Management Planned: Natural Airway  Additional Equipment:   Intra-op Plan:   Post-operative Plan:   Informed Consent: I have reviewed the patients History and Physical, chart, labs and discussed the procedure including the risks, benefits and alternatives for the proposed anesthesia with the patient or authorized representative who has indicated his/her understanding and acceptance.     Plan Discussed with: CRNA, Anesthesiologist and Surgeon  Anesthesia Plan Comments: (Patient for C/Section for arrest of dilation. Will use epidural for C/Section. M. Malen GauzeFoster, MD)      Anesthesia Quick Evaluation

## 2016-03-07 NOTE — Op Note (Signed)
Cesarean Section Procedure Note, tubal ligation  Sierra [name]  03/06/2016 - 03/07/2016  Indications: failure to progress , previous cesarean section, desires sterilization  Pre-operative Diagnosis:  failure to progress.   Post-operative Diagnosis: Same   Surgeon: Surgeon(s) and Role:    * Adam PhenixJames G Elon Lomeli, MD - Primary   Assistants: none  Anesthesia: epidural   Procedure Details:  The patient was seen in the Holding Room. The risks, benefits, complications, treatment options, and expected outcomes were discussed with the patient. The patient concurred with the proposed plan, giving informed consent. identified as Sierra [name] and the procedure verified as C-Section Delivery. A Time Out was held and the above information confirmed.  After induction of anesthesia, the patient was draped and prepped in the usual sterile manner. The pannous was retracted with Traxxi A transverse was made and carried down through the subcutaneous tissue to the fascia. Fascial incision was made and extended transversely. The fascia was separated from the underlying rectus tissue superiorly and inferiorly. The peritoneum was identified and entered. Peritoneal incision was extended longitudinally. The alexis device was placed. The utero-vesical peritoneal reflection was incised transversely and the bladder flap was bluntly freed from the lower uterine segment. A low transverse uterine incision was made. Delivered from cephalic presentation was a  Living newborn infant(s) or Female with Apgar scores of 8 at one minute and 9 at five minutes. Cord ph was not sent the umbilical cord was clamped and cut cord blood was obtained for evaluation. The placenta was removed Intact and appeared normal. The uterine outline, tubes and ovaries appeared normal. The Fallopian tubes were occluded with Filshie clips with good position seen The uterine incision was closed with running locked sutures of 0 Vicryl and a second layer  followed.   Hemostasis was observed. The peritoneum was closed with 2-0 Vicryl The fascia was then reapproximated with running sutures of 0Vicryl. The subcuticular closure was performed using 2-0plain gut. The skin was closed with 4-0Vicryl.   Instrument, sponge, and needle counts were correct prior the abdominal closure and were correct at the conclusion of the case.    Findings:   Estimated Blood Loss: *900 ml  Total IV Fluids: 2500 ml   Urine Output: 100 CC OF clear urine  Specimens:   Complications: no complications  Disposition: PACU - hemodynamically stable.   Maternal Condition: stable   Baby condition / location:  Couplet care / Skin to Skin  Attending Attestation: I was present and scrubbed for the entire procedure.   Signed: Surgeon(s): Adam PhenixJames G Mahdi Frye, MD 03/07/2016 11:44 PM

## 2016-03-07 NOTE — Anesthesia Procedure Notes (Signed)
Epidural Patient location during procedure: OB Start time: 03/07/2016 1:45 PM End time: 03/07/2016 1:49 PM  Staffing Anesthesiologist: Leilani AbleHATCHETT, Edvardo Honse Performed by: anesthesiologist   Preanesthetic Checklist Completed: patient identified, surgical consent, pre-op evaluation, timeout performed, IV checked, risks and benefits discussed and monitors and equipment checked  Epidural Patient position: sitting Prep: site prepped and draped and DuraPrep Patient monitoring: continuous pulse ox and blood pressure Approach: midline Location: L3-L4 Injection technique: LOR air  Needle:  Needle type: Tuohy  Needle gauge: 17 G Needle length: 9 cm and 9 Needle insertion depth: 9 cm Catheter type: closed end flexible Catheter size: 19 Gauge Catheter at skin depth: 15 cm Test dose: negative and Other  Assessment Sensory level: T9 Events: blood not aspirated, injection not painful, no injection resistance, negative IV test and no paresthesia  Additional Notes Reason for block:procedure for pain

## 2016-03-07 NOTE — Progress Notes (Signed)
Patient ID: Sierra [name], female   DOB: 11/15/1990, 25 y.o.   MRN: [id number]  Feeling ctx a little more; SROM@0615 ; Pit going during the night  BPs 143/84, 122/78, 109/71 EFM 130s, +accels, no decels, occ mi variables Ctx q 2-5 mins, Pit @ 614mu/min Cx 5-6/60/-3-2  cHTN TOLAC  Check cx in 2-3 hrs or sooner prn Anticipate SVD  Cam HaiSHAW, Chinonso Linker CNM 03/07/2016 6:46 AM

## 2016-03-07 NOTE — Progress Notes (Addendum)
Sierra [name] is a 25 y.o. [id number] at 6250w1d admitted for induction of labor due to [place]. She is a TOLAC with previous c/s for failure to progress.  Subjective: Pt breathing with contractions, desires epidural but allergy to Morphine is anaphylaxis but can take oral narcotics so unsure what medications can be used.   Objective: BP 109/32 mmHg  Pulse 87  Temp(Src) 98.1 F (36.7 C) (Oral)  Resp 18  Ht 5\' 2"  (1.575 m)  Wt 313 lb (141.976 kg)  BMI 57.23 kg/m2  LMP 06/05/2015      FHT:  FHR: 135 bpm, variability: moderate,  accelerations:  Present,  decelerations:  Absent UC:   regular, every 2-3 minutes SVE:   Dilation: 4.5 Effacement (%): 60 Station: -3, -2 Exam by:: L Leftwich-Kirby CNM IUPC placed without difficulty, pt tolerated well.  Labs: Lab Results  Component Value Date   WBC 9.2 03/06/2016   HGB 10.4* 03/06/2016   HCT 31.8* 03/06/2016   MCV 69.0* 03/06/2016   PLT 248 03/06/2016    Assessment / Plan: Induction of labor due to cHTN,  progressing well on pitocin  Labor: Progressing normally. Pitocin on 20, rate halved when IUPC placed.  Preeclampsia:  n/a Fetal Wellbeing:  Category I Pain Control:  Labor support without medications I/D:  n/a Anticipated MOD:  VBAC  LEFTWICH-KIRBY, Prarthana Parlin 03/07/2016, 11:20 AM

## 2016-03-08 ENCOUNTER — Encounter (HOSPITAL_COMMUNITY): Payer: Self-pay

## 2016-03-08 LAB — CBC
HCT: 27.2 % — ABNORMAL LOW (ref 36.0–46.0)
Hemoglobin: 8.8 g/dL — ABNORMAL LOW (ref 12.0–15.0)
MCH: 22.3 pg — ABNORMAL LOW (ref 26.0–34.0)
MCHC: 32.4 g/dL (ref 30.0–36.0)
MCV: 68.9 fL — AB (ref 78.0–100.0)
PLATELETS: 216 10*3/uL (ref 150–400)
RBC: 3.95 MIL/uL (ref 3.87–5.11)
RDW: 17.5 % — ABNORMAL HIGH (ref 11.5–15.5)
WBC: 15.3 10*3/uL — AB (ref 4.0–10.5)

## 2016-03-08 MED ORDER — SENNOSIDES-DOCUSATE SODIUM 8.6-50 MG PO TABS
2.0000 | ORAL_TABLET | ORAL | Status: DC
  Administered 2016-03-08: 2 via ORAL
  Filled 2016-03-08 (×3): qty 2

## 2016-03-08 MED ORDER — WITCH HAZEL-GLYCERIN EX PADS: 1.0000 "application " | MEDICATED_PAD | CUTANEOUS | Status: DC | PRN

## 2016-03-08 MED ORDER — LACTATED RINGERS IV SOLN
INTRAVENOUS | Status: DC
  Administered 2016-03-08: 06:00:00 via INTRAVENOUS

## 2016-03-08 MED ORDER — DIBUCAINE 1 % RE OINT
1.0000 "application " | TOPICAL_OINTMENT | RECTAL | Status: DC | PRN
  Filled 2016-03-08: qty 28.4

## 2016-03-08 MED ORDER — TETANUS-DIPHTH-ACELL PERTUSSIS 5-2.5-18.5 LF-MCG/0.5 IM SUSP
0.5000 mL | Freq: Once | INTRAMUSCULAR | Status: DC
  Filled 2016-03-08: qty 0.5

## 2016-03-08 MED ORDER — COCONUT OIL OIL
1.0000 "application " | TOPICAL_OIL | Status: DC | PRN
  Filled 2016-03-08: qty 120

## 2016-03-08 MED ORDER — SIMETHICONE 80 MG PO CHEW
80.0000 mg | CHEWABLE_TABLET | ORAL | Status: DC
  Administered 2016-03-08: 80 mg via ORAL
  Filled 2016-03-08 (×3): qty 1

## 2016-03-08 MED ORDER — SIMETHICONE 80 MG PO CHEW
80.0000 mg | CHEWABLE_TABLET | ORAL | Status: DC | PRN
  Filled 2016-03-08: qty 1

## 2016-03-08 MED ORDER — OXYTOCIN 40 UNITS IN LACTATED RINGERS INFUSION - SIMPLE MED: 2.5000 [IU]/h | INTRAVENOUS | Status: AC

## 2016-03-08 MED ORDER — OXYCODONE HCL 5 MG PO TABS
5.0000 mg | ORAL_TABLET | ORAL | Status: DC | PRN
  Administered 2016-03-08 (×2): 5 mg via ORAL
  Filled 2016-03-08 (×3): qty 1

## 2016-03-08 MED ORDER — NALBUPHINE HCL 10 MG/ML IJ SOLN
INTRAMUSCULAR | Status: AC
  Filled 2016-03-08: qty 1

## 2016-03-08 MED ORDER — PRENATAL MULTIVITAMIN CH
1.0000 | ORAL_TABLET | Freq: Every day | ORAL | Status: DC
  Administered 2016-03-08 – 2016-03-09 (×2): 1 via ORAL
  Filled 2016-03-08 (×3): qty 1

## 2016-03-08 MED ORDER — MENTHOL 3 MG MT LOZG
1.0000 | LOZENGE | OROMUCOSAL | Status: DC | PRN
  Filled 2016-03-08: qty 9

## 2016-03-08 MED ORDER — IBUPROFEN 600 MG PO TABS
600.0000 mg | ORAL_TABLET | Freq: Four times a day (QID) | ORAL | Status: DC
  Administered 2016-03-08 – 2016-03-09 (×5): 600 mg via ORAL
  Filled 2016-03-08 (×5): qty 1

## 2016-03-08 MED ORDER — SIMETHICONE 80 MG PO CHEW
80.0000 mg | CHEWABLE_TABLET | Freq: Three times a day (TID) | ORAL | Status: DC
  Administered 2016-03-08 – 2016-03-09 (×5): 80 mg via ORAL
  Filled 2016-03-08 (×7): qty 1

## 2016-03-08 MED ORDER — ACETAMINOPHEN 325 MG PO TABS
650.0000 mg | ORAL_TABLET | ORAL | Status: DC | PRN
  Administered 2016-03-08: 650 mg via ORAL
  Filled 2016-03-08: qty 2

## 2016-03-08 MED ORDER — ZOLPIDEM TARTRATE 5 MG PO TABS: 5.0000 mg | ORAL_TABLET | Freq: Every evening | ORAL | Status: DC | PRN

## 2016-03-08 MED ORDER — DIPHENHYDRAMINE HCL 25 MG PO CAPS
25.0000 mg | ORAL_CAPSULE | Freq: Four times a day (QID) | ORAL | Status: DC | PRN
  Filled 2016-03-08: qty 1

## 2016-03-08 NOTE — Anesthesia Postprocedure Evaluation (Signed)
Anesthesia Post Note  Patient: Sierra [name]  Procedure(s) Performed: Procedure(s) (LRB): CESAREAN SECTION (N/A)  Patient location during evaluation: Mother Baby Anesthesia Type: Epidural Level of consciousness: awake and alert Pain management: pain level controlled Vital Signs Assessment: post-procedure vital signs reviewed and stable Respiratory status: spontaneous breathing, nonlabored ventilation and respiratory function stable Cardiovascular status: stable Postop Assessment: no headache, no backache and epidural receding Anesthetic complications: no     Last Vitals:  Filed Vitals:   03/08/16 0420 03/08/16 0630  BP: 111/64 111/65  Pulse: 78 69  Temp: 36.6 C   Resp: 18 18    Last Pain:  Filed Vitals:   03/08/16 0745  PainSc: 4    Pain Goal: Patients Stated Pain Goal: 3 (03/08/16 0420)               Junious SilkGILBERT,Giovan Pinsky

## 2016-03-08 NOTE — Progress Notes (Signed)
Post Op Day 1  Subjective:  Sierra [name] is a 25 y.o. [id number] 2449w1d s/p rLTCS 2/2 failure to progress. IOL for cHTN.  No acute events overnight.  Pt denies problems with ambulating, or po intake. She still has a foley which is to be removed today. She denies nausea or vomiting.  Pain is well controlled.  She has not had flatus. She has not had bowel movement.  Lochia Small.  Plan for birth control is BTL.  Method of Feeding: bottle  Objective: BP 111/65 mmHg  Pulse 69  Temp(Src) 97.9 F (36.6 C) (Oral)  Resp 18  Ht 5\' 2"  (1.575 m)  Wt 141.976 kg (313 lb)  BMI 57.23 kg/m2  SpO2 100%  LMP 06/05/2015  Breastfeeding? Unknown  Physical Exam:  General: alert, cooperative and no distress Lochia:normal flow Chest: CTAB Heart: RRR no m/r/g Abdomen: +BS, soft, nontender, fundus firm Uterine Fundus: firm, incision dressing with minimal red blood, will monitor  DVT Evaluation: No evidence of DVT seen on physical exam. Extremities: no edema   Recent Labs  03/07/16 1209 03/08/16 0559  HGB 11.0* 8.8*  HCT 33.8* 27.2*    Assessment/Plan:  ASSESSMENT: Sierra Munroeenisha Proud is a 25 y.o. [id number] 4249w1d pod#1 s/p pLTCS 2/2 failure to progress. IOL for cHTN doing well.   Expected Management Remove Foley today    LOS: 2 days   Palma HolterKanishka G Yazmen Briones 03/08/2016, 8:57 AM

## 2016-03-08 NOTE — Anesthesia Postprocedure Evaluation (Signed)
Anesthesia Post Note  Patient: Sierra [name]  Procedure(s) Performed: Procedure(s) (LRB): CESAREAN SECTION (N/A)  Patient location during evaluation: PACU Anesthesia Type: Epidural Level of consciousness: awake and alert and oriented Pain management: pain level controlled Vital Signs Assessment: post-procedure vital signs reviewed and stable Respiratory status: spontaneous breathing, nonlabored ventilation and respiratory function stable Cardiovascular status: blood pressure returned to baseline and stable Postop Assessment: no signs of nausea or vomiting, patient able to bend at knees, epidural receding, no backache and no headache Anesthetic complications: no     Last Vitals:  Filed Vitals:   03/08/16 0045 03/08/16 0103  BP: 132/81 126/79  Pulse: 87 97  Temp:  36.8 C  Resp: 17 16    Last Pain:  Filed Vitals:   03/08/16 0124  PainSc: 0-No pain   Pain Goal:                 Nikeia Henkes A.

## 2016-03-08 NOTE — Addendum Note (Signed)
Addendum  created 03/08/16 0756 by Junious SilkMelinda Lynnelle Mesmer, CRNA   Modules edited: Charges VN, Clinical Notes   Clinical Notes:  File: [id number]

## 2016-03-09 MED ORDER — IBUPROFEN 600 MG PO TABS: 600.0000 mg | ORAL_TABLET | Freq: Four times a day (QID) | ORAL | Status: DC

## 2016-03-09 MED ORDER — OXYCODONE-ACETAMINOPHEN 5-325 MG PO TABS: 1.0000 | ORAL_TABLET | ORAL | Status: DC | PRN

## 2016-03-09 NOTE — Discharge Summary (Signed)
OB Discharge Summary     Patient Name: Sierra [name] DOB: [birth year] MRN: [id number]  Date of admission: 03/06/2016 Delivering MD: Adam Phenix   Date of discharge: 03/09/2016  Admitting diagnosis: INDUCTION Intrauterine pregnancy: [redacted]w[redacted]d     Secondary diagnosis:  Active Problems:   Chronic hypertension in pregnancy  Additional problems: Obesity     Discharge diagnosis: Term Pregnancy Delivered and repeat LTCS for arrested dilatation; BTL                                                                                                Post partum procedures: none  Augmentation: Pitocin, AROM  Complications: None  Hospital course:  Induction of Labor With Cesarean Section  25 y.o. yo [id number] at [redacted]w[redacted]d was admitted to the hospital 03/06/2016 for induction of labor. Patient had a labor course significant for pitocin IOL indicated by [facility]. Maximum dilation 4cm.  The patient went for cesarean section due to Arrest of Dilation, and delivered a Viable infant,@BABYSUPPRESS (DBLINK,ept,110,,1,,) Membrane Rupture Time/Date: )6:15 AM ,03/07/2016    of operation can be found in separate operative Note.  Patient had an uncomplicated postpartum course. She is ambulating, tolerating a regular diet, passing flatus, and urinating well.  Patient is discharged home in stable condition on 03/09/2016.       POD#2  SUBJECTIVE: She is ambulating without orthostatic sx. Tolerating regular diet.  +flatus.  Diuresing well. Has help at home. Denies Percocet allergy. Had wound breakdown with prior LTCS.                              OBJECTIVE:  Physical exam  Filed Vitals:   03/08/16 1645 03/08/16 2026 03/08/16 2330 03/09/16 0650  BP: 116/57 117/63 120/88 114/70  Pulse: 88 70 94 71  Temp: 97.8 F (36.6 C) 98.1 F (36.7 C) 98 F (36.7 C) 98 F (36.7 C)  TempSrc: Oral Oral Oral   Resp: Height:      Weight:      SpO2: 100% 100% 92%    General: alert, cooperative and no  distress Breasts: filling Lochia: appropriate Uterine Fundus: firm at u-55fb Incision: Large panus, honeycomb dressing with moderate amount dried blood on left> dressing removed, incision C/D/I with SQ sutures, honeycomb dressing replaced DVT Evaluation: No evidence of DVT seen on physical exam. Labs: Lab Results  Component Value Date   WBC 15.3* 03/08/2016   HGB 8.8* 03/08/2016   HCT 27.2* 03/08/2016   MCV 68.9* 03/08/2016   PLT 216 03/08/2016   CMP Latest Ref Rng 09/28/2015  Glucose 65 - 99 mg/dL 76  BUN 7 - 25 mg/dL 8  Creatinine 1.30 - 8.65 mg/dL 7.84  Sodium 696 - 295 mmol/L 138  Potassium 3.5 - 5.3 mmol/L 3.9  Chloride 98 - 110 mmol/L 107  CO2 20 - 31 mmol/L 21  Calcium 8.6 - 10.2 mg/dL 2.8(U)  Total Protein 6.1 - 8.1 g/dL 6.2  Total Bilirubin 0.2 - 1.2 mg/dL 0.2  Alkaline Phos 33 - 115 U/L 39  AST  10 - 30 U/L 12  ALT 6 - 29 U/L 13    Discharge instruction: per After Visit Summary and "Baby and Me Booklet".  After visit meds:    Medication List    STOP taking these medications        aspirin EC 81 MG tablet      TAKE these medications        ibuprofen 600 MG tablet  Commonly known as:  ADVIL,MOTRIN  Take 1 tablet (600 mg total) by mouth every 6 (six) hours.     oxyCODONE-acetaminophen 5-325 MG tablet  Commonly known as:  PERCOCET/ROXICET  Take 1 tablet by mouth every 4 (four) hours as needed.     PRENATAL VITAMINS PO  Take 1 tablet by mouth daily.        Diet: routine diet  Activity: Advance as tolerated. Pelvic rest for 6 weeks.   Outpatient follow up:6 weeks Will caL woc IF ANY CONCERNS RE: INCISION PRIOR TO HER APPOINTMENT.  Follow up Appt:Future Appointments Date Time Provider Department Center  04/22/2016 1:40 PM Adam PhenixJames G Arnold, MD [facility]   Follow up Visit:No Follow-up on file.  Postpartum contraception: Tubal Ligation  Newborn Data: Live born female  Birth Weight: 7 lb 11.6 oz (3505 g) APGAR: 8, 9  Baby Feeding:  Bottle Disposition:home with mother   03/09/2016 Jenine Krisher, CNM

## 2016-03-09 NOTE — Discharge Instructions (Signed)

## 2016-04-18 ENCOUNTER — Telehealth: Payer: Self-pay | Admitting: *Deleted

## 2016-04-18 NOTE — Telephone Encounter (Signed)
Received a call note from Nurse call center  Stating paitent called and said she was returning a call.

## 2016-04-19 NOTE — Telephone Encounter (Signed)
I called Sierra [name] and she states it was a missed call from us, no message. We discussed I did not see that nursing staff had called her, we discussed her upcoming appointment on 8/28 and decided that may have been what the call was about.

## 2016-04-22 ENCOUNTER — Ambulatory Visit (INDEPENDENT_AMBULATORY_CARE_PROVIDER_SITE_OTHER): Payer: BLUE CROSS/BLUE SHIELD | Admitting: Obstetrics and Gynecology

## 2016-04-22 ENCOUNTER — Encounter: Payer: Self-pay | Admitting: Obstetrics and Gynecology

## 2016-04-22 ENCOUNTER — Ambulatory Visit (INDEPENDENT_AMBULATORY_CARE_PROVIDER_SITE_OTHER): Payer: Self-pay | Admitting: Clinical

## 2016-04-22 VITALS — BP 128/70 | HR 76 | Ht 62.0 in | Wt 295.8 lb

## 2016-04-22 DIAGNOSIS — N939 Abnormal uterine and vaginal bleeding, unspecified: Secondary | ICD-10-CM | POA: Insufficient documentation

## 2016-04-22 DIAGNOSIS — F4323 Adjustment disorder with mixed anxiety and depressed mood: Secondary | ICD-10-CM

## 2016-04-22 DIAGNOSIS — D649 Anemia, unspecified: Secondary | ICD-10-CM | POA: Insufficient documentation

## 2016-04-22 DIAGNOSIS — O99345 Other mental disorders complicating the puerperium: Principal | ICD-10-CM

## 2016-04-22 DIAGNOSIS — F53 Postpartum depression: Secondary | ICD-10-CM | POA: Insufficient documentation

## 2016-04-22 LAB — CBC
HCT: 29.6 % — ABNORMAL LOW (ref 35.0–45.0)
Hemoglobin: 9 g/dL — ABNORMAL LOW (ref 11.7–15.5)
MCH: 21.6 pg — ABNORMAL LOW (ref 27.0–33.0)
MCHC: 30.4 g/dL — ABNORMAL LOW (ref 32.0–36.0)
MCV: 71 fL — AB (ref 80.0–100.0)
MPV: 10.5 fL (ref 7.5–12.5)
PLATELETS: 420 10*3/uL — AB (ref 140–400)
RBC: 4.17 MIL/uL (ref 3.80–5.10)
RDW: 17 % — AB (ref 11.0–15.0)
WBC: 6.8 10*3/uL (ref 3.8–10.8)

## 2016-04-22 LAB — TSH: TSH: 0.78 m[IU]/L

## 2016-04-22 NOTE — Progress Notes (Signed)
Subjective

## 2016-04-22 NOTE — Progress Notes (Addendum)
Obstetrics Visit Postpartum Visit  Appointment Date: 04/22/2016  OBGYN Clinic: [facility]  Primary Care Provider: patient states she has one in [place], [place]  Chief Complaint:  Chief Complaint  Patient presents with  . Postpartum Care    History of Present Illness: Kasiya Burck is a 25 y.o. African-American [phone] (Patient's last menstrual period was 04/07/2016.), seen for the above chief complaint. Her past medical history is significant for BMI 54, cHTN, h/o c-section   She is s/p failed TOLAC and rpt c-section on 7/13 with BTL via filshie clips; she was discharged to home on POD#2  LMP: unsure. Vaginal bleeding or discharge: Yes but none currently or in the past week.  Breast or formula feeding: formula PP depression s/s: Yes  Pap smear: no abnormalities (date: 2016)  Patient states she is under a lot of stress. There is only one car in the household so she has to be the primary transport for her family. She also having to take care of her grandmother and her parents and the rest of her other children. She states she is so busy that she isn't able to get much sleep  She states she has a h/o HA and endorses a daily frontal/left sided occipital HA sometimes with pressure that is helped with being in a dark room and cold wash cloth.   She states she has a h/o AUB with multiple episodes of bleeding prior to pregnancy and is having similar s/s.   Review of Systems: no change in vision, anemia s/s.   Medications Current Outpatient Prescriptions  Medication Sig Dispense Refill  . Prenatal Multivit-Min-Fe-FA (PRENATAL VITAMINS PO) Take 1 tablet by mouth daily.     No current facility-administered medications for this visit.     Allergies Benadryl [diphenhydramine]; Demerol [meperidine]; and Morphine and related  Physical Exam:  BP 128/70   Pulse 76   Ht 5\' 2"  (1.575 m)   Wt 295 lb 12.8 oz (134.2 kg)   LMP 04/07/2016 Comment: lasted 7-8 days, heavy  Breastfeeding? No    BMI 54.10 kg/m  Body mass index is 54.1 kg/m. General appearance: Well nourished, well developed female in no acute distress.  Cardiovascular: normal s1 and s2.  No murmurs, rubs or gallops. Respiratory:  Clear to auscultation bilateral. Normal respiratory effort Abdomen: positive bowel sounds and no masses, hernias; diffusely non tender to palpation, non distended. C/d/i low transverse incision Neuro/Psych:  CN 2-12 grossly intact. Normal gait. 5/5 strength diffusely and normal sensation. EOMI, PERRL. Normal mood and affect.  Skin:  Warm and dry.   Laboratory: None  PP Depression Screening:  + (see flowsheet)  Assessment: pt stable.   Plan:  *PP depression: I d/w her at length re: her s/s in addition to having her see SW (J. McMannus) today. I reviewed the screen with her and she denies any SI or HI and contracts for safety. We will see her back in 4wks for follow up for this and GYN AUB (see below) but she was also told to call use for ay worsening s/s and to contact her PCP for closer follow up too. Will check TSH. 45m *AUB: pt told this could be her going back to how she was pre-pregnancy but will check tsh, cbc and beta hcg. She was anemic when she left the hospital but was asymptomatic; she is not currently taking iron. Will see her back for f/u in one month to see if s/s are resolving and if not, can order a pelvic u/s. 63m *  HA: normal neuro exam and could be related to stress, lack of sleep, abnormal thyroid function or anemia. Will check labs and pt told to contact pcp for primary and depression co-follow up. 8036m *Hands: b/l and seems slightly puffy but nttp and normal sensation and strength and motor function. Can keep an eye on it for now; pt had some CTS during her pregnancy too.  RTC 4 weeks  Cornelia Copaharlie Hao Dion, Jr MD Attending Center for [facility](Faculty Practice)

## 2016-04-22 NOTE — Progress Notes (Signed)
States is still having wrist/hand pain -hard to hold baby for long. States had 2nd period - very heavy.

## 2016-04-22 NOTE — Progress Notes (Signed)
  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 [facility]. Pt would benefit from psychoeducation and brief therapeutic interventions regarding coping with symptoms of anxiety and depression. Pt may benefit from group support. Stage of Change: contemplative  PLAN: 1. F/U with behavioral health clinician in two weeks, or as needed 2. Psychiatric Medications: none 3. Behavioral recommendations:   -Practice daily relaxation exercise prior to naps/sleep -Consider adding iron-rich foods, with vitamin C, such as lentils with lemon juice, into diet -Consider placing healthy snacks within reach throughout day -Read educational material regarding coping with symptoms of depression and anxiety -Consider Feelings After Birth support group [facility] 10am [facility] -Remember importance of self care for overall wellbeing -If symptoms get worse, and feels suicidal, call 911, Mobile Crisis, or any 24/hour crisis line given in office visit today (though she denies today)  SUBJECTIVE: Pt. referred by [place] Bingharlie Pickens, MD, for symptoms of depression and anxiety Pt. reports the following symptoms/concerns: Pt states that her primary concern is overwhelming tiredness, having difficulty going to sleep, and being hungry but unable to eat by the time she is able to prepare meals. Pt feels great responsibility to care for newborn, 5yo son with autism, 71yo grandmother w dementia. She says she does not ever want to harm herself or others, her children make her happy to be alive, no plan of harm; simply wants to feel rested.  Duration of problem: Less than 3 months(beginning in late pregnancy) Severity: severe   OBJECTIVE: Orientation & Cognition: Oriented x3. Thought processes normal and appropriate to situation. Mood: appropriate Affect: appropriate Appearance: appropriate Risk of harm to self or others: no known risk of harm to self or  others Substance use: none Assessments administered: PHQ9: 14/ GAD7: 19  Diagnosis: Adjustment disorder with mixed anxious and depressed mood CPT Code: F43.23  -------------------------------------------- Other(s) present in the room: none  Time spent with patient in exam room: 45 minutes, 2:15-3:00pm  Dep[facility] screen [facility] 2/9 04/22/2016 12/14/2015 10/18/2015 08/30/2015  Decreased Interest 2 0 0 0  Down, Depressed, Hopeless 2 0 0 0  PHQ - 2 Score 4 0 0 0  Altered sleeping 1 - - -  Tired, decreased energy 3 - - -  Change in appetite 2 - - -  Feeling bad or failure about yourself  1 - - -  Trouble concentrating 0 - - -  Moving slowly or fidgety/restless 1 - - -  Suicidal thoughts 2 - - -  PHQ-9 Score 14 - - -   GAD 7 : Generalized Anxiety Score 04/22/2016  Nervous, Anxious, on Edge 3  Control/stop worrying 3  Worry too much - different things 3  Trouble relaxing 3  Restless 3  Easily annoyed or irritable 2  Afraid - awful might happen 2  Total GAD 7 Score 19  Anxiety Difficulty Somewhat difficult

## 2016-04-23 LAB — HCG, QUANTITATIVE, PREGNANCY

## 2016-04-25 LAB — BETA HCG QUANT (REF LAB): Beta hCG, Tumor Marker: <2 m[IU]/mL (ref <5.0)

## 2016-05-20 ENCOUNTER — Encounter: Payer: Self-pay | Admitting: Obstetrics and Gynecology

## 2016-05-20 ENCOUNTER — Ambulatory Visit: Payer: Self-pay

## 2016-05-20 ENCOUNTER — Ambulatory Visit: Payer: Self-pay | Admitting: Obstetrics and Gynecology

## 2016-05-20 NOTE — Progress Notes (Signed)
Patient did not keep GYN appointment for 05/20/2016.  Lavelle Berland, Jr MD Attending Center for [facility] (Faculty Practice)   

## 2016-05-22 ENCOUNTER — Ambulatory Visit: Payer: Self-pay

## 2016-06-23 IMAGING — US US MFM OB FOLLOW-UP
1 series · 14 of 28 positions shown · non-contrast
Comparison: none

[Series 1: us mfm ob follow-up · 14 of 60 slices shown]
[im 3/60]
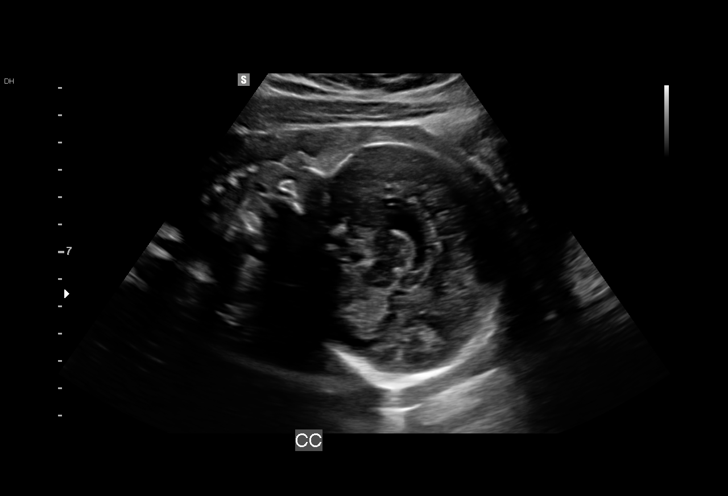
[im 7/60]
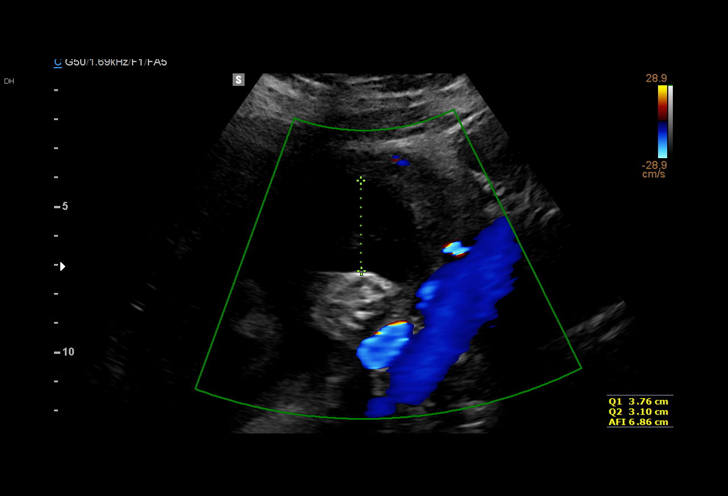
[im 11/60]
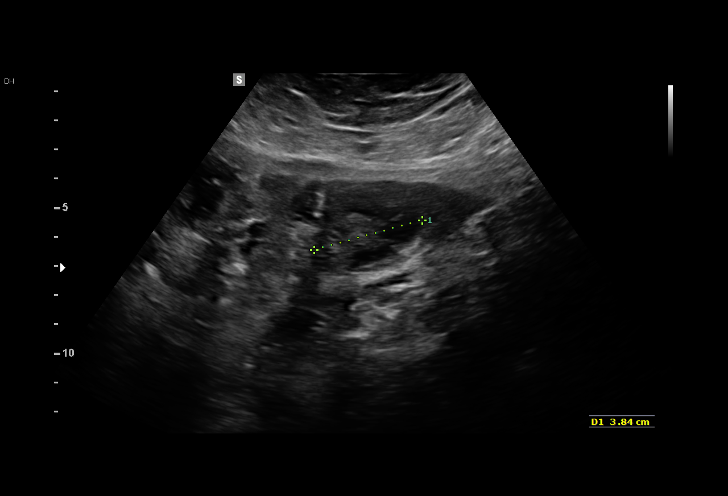
[im 16/60]
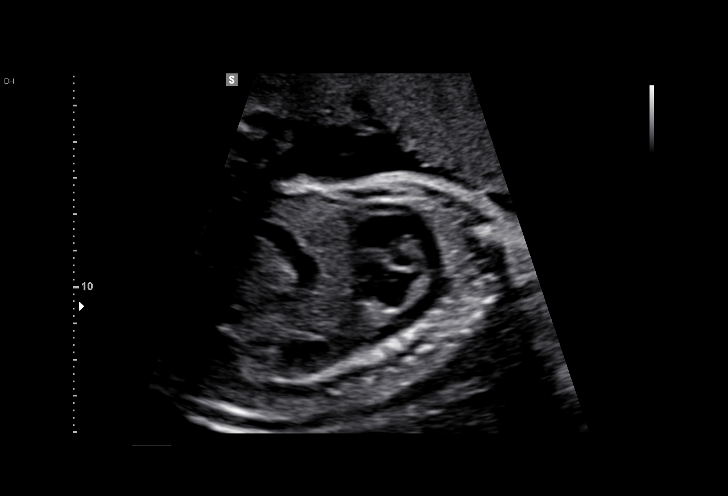
[im 20/60]
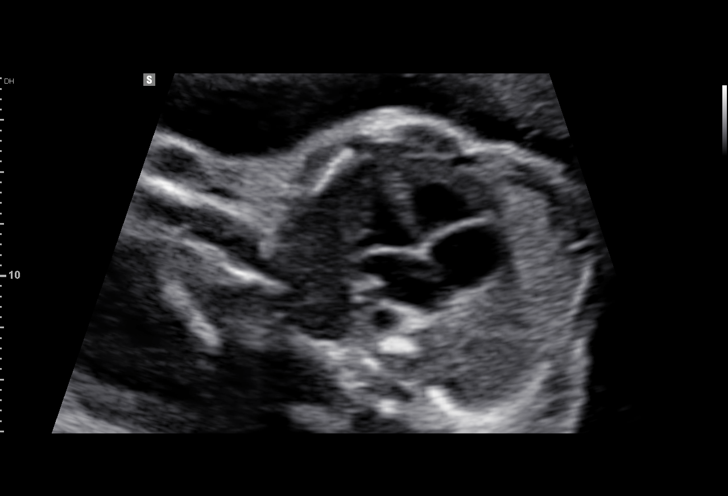
[im 25/60]
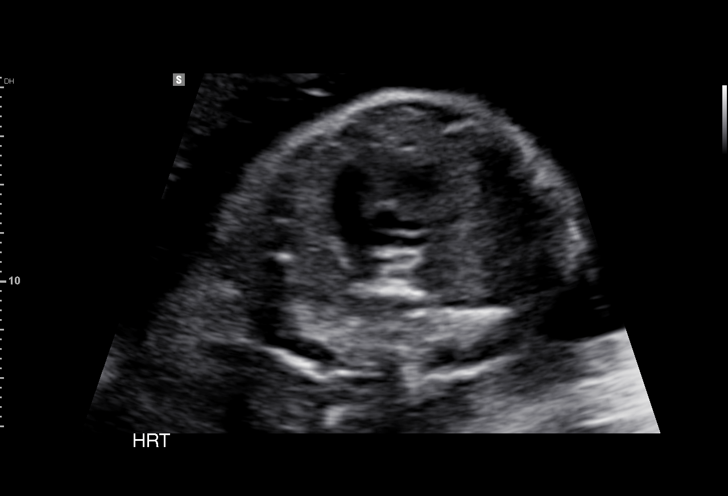
[im 29/60]
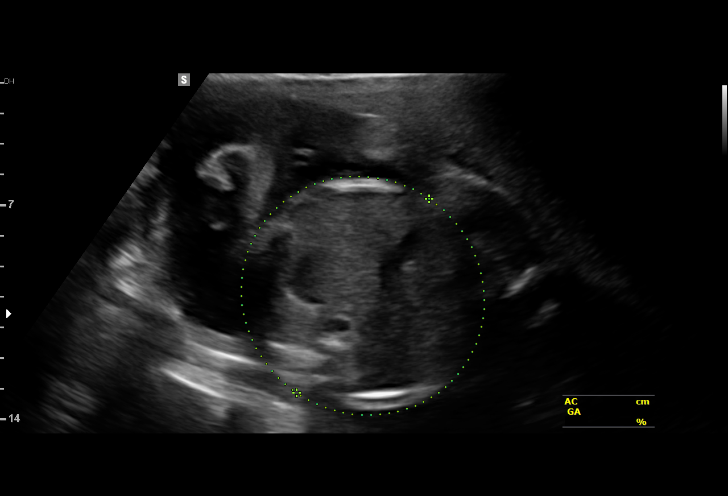
[im 33/60]
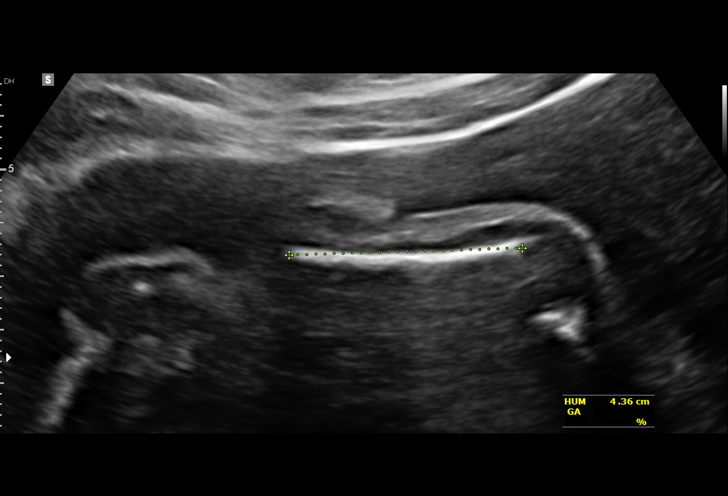
[im 38/60]
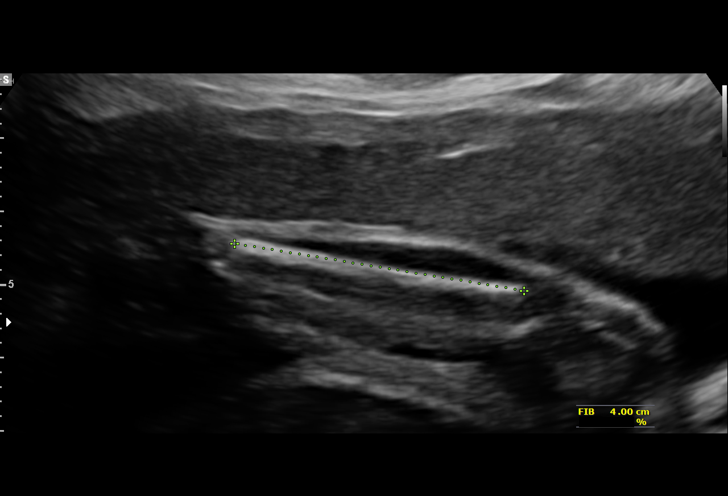
[im 42/60]
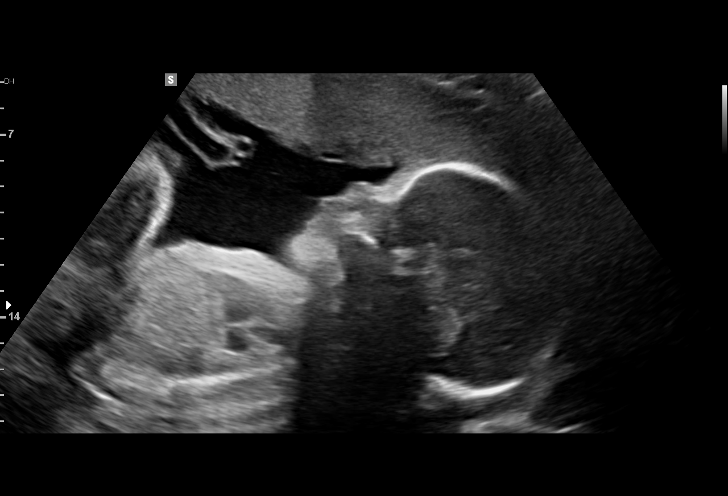
[im 46/60]
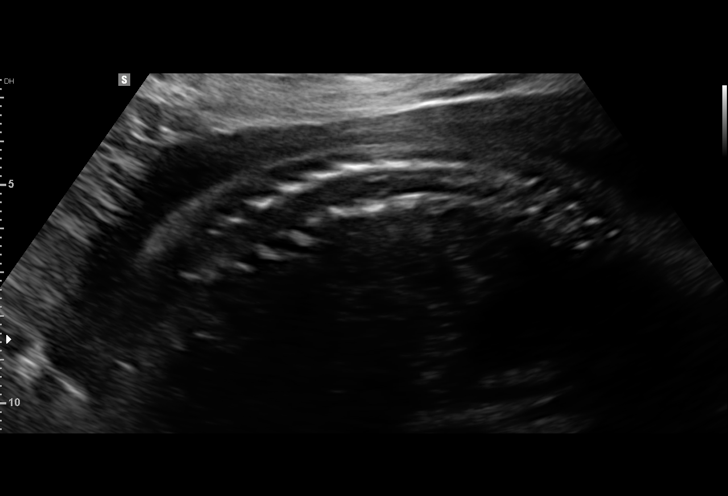
[im 51/60]
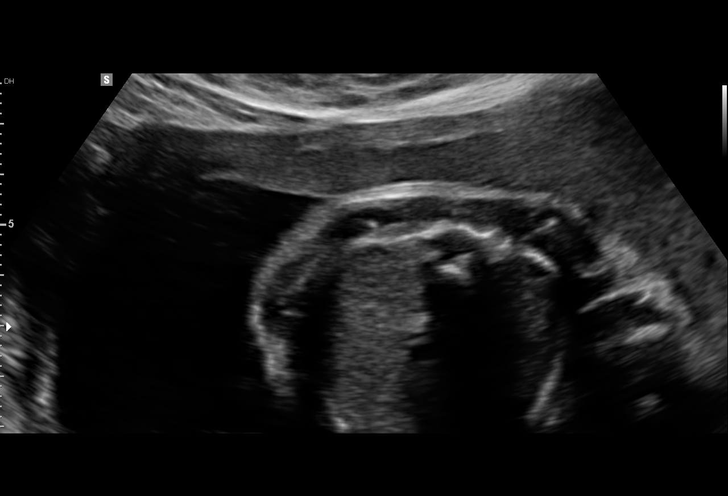
[im 55/60]
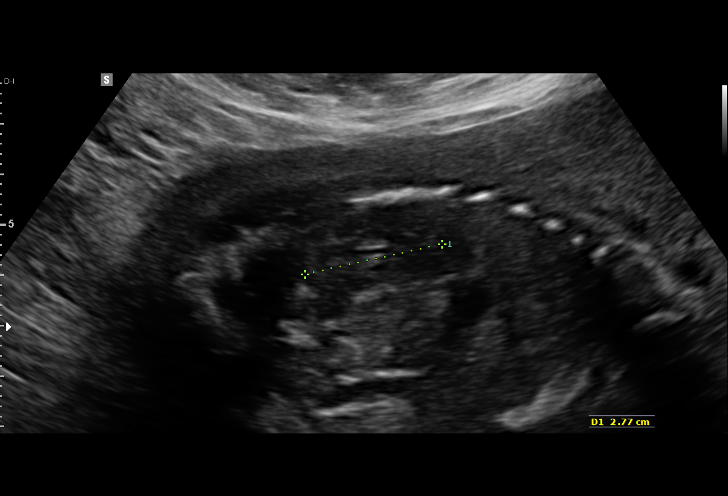
[im 60/60]
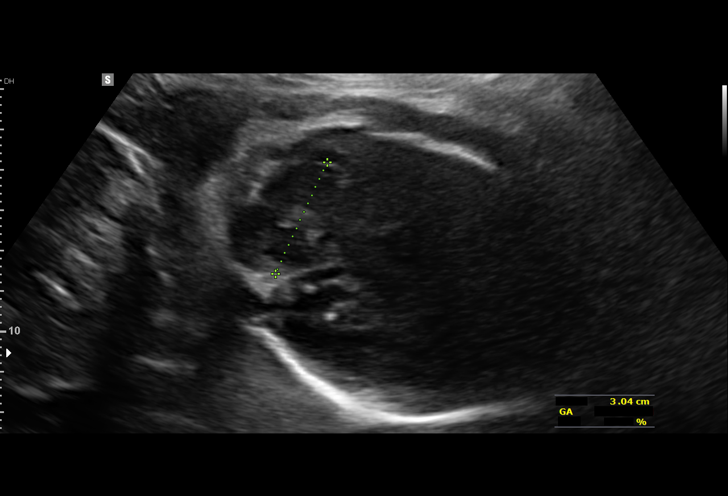

[14 of 28 positions shown; findings below may reference images not displayed]

[facility] Clinic
[REDACTED]-
Faculty Physician

1  JIAN SHUMWAY            [id number]      [id number]     [id number]
Indications

28 weeks gestation of pregnancy
Hypertension - Chronic/Pre-existing (no
meds)
Previous cesarean delivery, antepartum
Maternal morbid obesity
Follow-up incomplete fetal anatomic            Z36
evaluation
OB History

Height:       5'2"    Weight:   301       BMI:
Gravidity:    5         Term:   1        Prem:   0        SAB:   3
TOP:          0       Ectopic:  0        Living: 1
Fetal Evaluation

Num Of Fetuses:     1
Fetal Heart         145
Rate(bpm):
Cardiac Activity:   Observed
Presentation:       Cephalic
Placenta:           Anterior, above cervical os
P. Cord Insertion:  Previously Visualized

Amniotic Fluid
AFI FV:      Subjectively within normal limits
AFI Sum:     16.9    cm      63   %Tile     Larg Pckt:   5.37   cm
RUQ:   3.76   cm    RLQ:    4.67   cm    LUQ:   3.1     cm   LLQ:    5.37   cm
Biometry

BPD:      70.7  mm     G. Age:  28w 3d                  CI:        71.38   %   70 - 86
FL/HC:      17.0   %   18.8 -
HC:      266.5  mm     G. Age:  29w 0d         47  %    HC/AC:      1.08       1.05 -
AC:      247.3  mm     G. Age:  29w 0d         67  %    FL/BPD:     63.9   %   71 - 87
FL:       45.2  mm     G. Age:  25w 0d        < 3  %    FL/AC:      18.3   %   20 - 24
HUM:      43.4  mm     G. Age:  25w 6d        < 5  %
CER:      30.4  mm     G. Age:  26w 5d         24  %

RIGHT
ULN:      42.7  mm     G. Age:  27w 4d         21  %
TIB:      41.5  mm     G. Age:  25w 6d        < 5  %
RAD:      37.4  mm     G. Age:  26w 1d         31  %
FIB:        40  mm     G. Age:  25w 0d         28  %
LEFT

Est. FW:    5002  gm      2 lb 7 oz     39  %
Gestational Age

LMP:           27w 3d       Date:   06/05/15                 EDD:   03/11/16
U/S Today:     27w 6d                                        EDD:   03/08/16
Best:          28w 1d    Det. By:   Early Ultrasound         EDD:   03/06/16
(07/19/15)
Anatomy

Cranium:          Appears normal         Aortic Arch:      Appears normal
Fetal Cavum:      Appears normal         Ductal Arch:      Appears normal
Ventricles:       Appears normal         Diaphragm:        Appears normal
Choroid Plexus:   Previously seen        Stomach:          Appears normal, left
sided
Cerebellum:       Appears normal         Abdomen:          Appears normal
Posterior Fossa:  Appears normal         Abdominal Wall:   Previously seen
Nuchal Fold:      Not applicable (>20    Cord Vessels:     Previously seen
wks GA)
Face:             Orbits and profile     Kidneys:          Appear normal
previously seen
Lips:             Previously seen        Bladder:          Appears normal
Fetal Thoracic:   Appears normal         Spine:            Appears normal
Heart:            Appears normal         Upper             Previously seen
(4CH, axis, and        Extremities:
situs)
RVOT:             Appears normal         Lower             Previously seen
Extremities:
LVOT:             Appears normal

Other:  Fetus appears to be a male. Heels previously seen. Technically
difficult due to maternal habitus.
Cervix Uterus Adnexa

Cervix
Not visualized.

Left Ovary
Within normal limits.
Right Ovary
Within normal limits.

Adnexa:       No abnormality visualized.
Impression

Single IUP at 28w 1d
CHTN, obesity
Interval fetal growth is appropriate (39th %tile)
Anterior placenta without previa
Normal amniotic fluid volume
Recommendations

Recommend follow-up ultrasound examination in 4 weeks
(CHTN)

## 2016-08-11 IMAGING — US US MFM FETAL BPP W/O NON-STRESS
1 series · 15 of 21 positions shown · non-contrast
Comparison: none

[Series 1: us mfm fetal bpp w/o non-stress · 21 acquisitions, 15 frames shown]
[im 1/21]
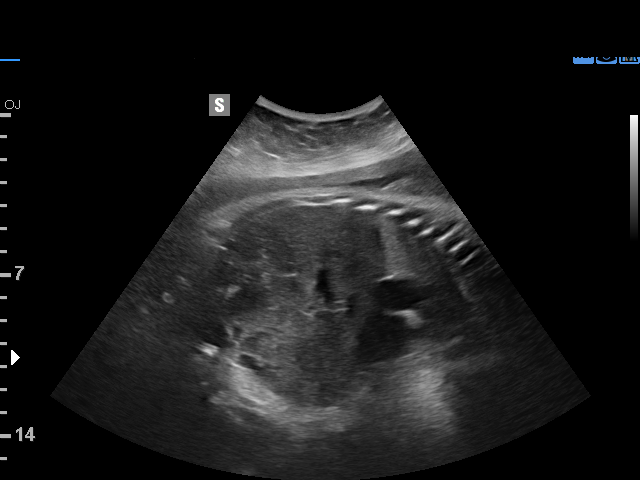
[im 3/21]
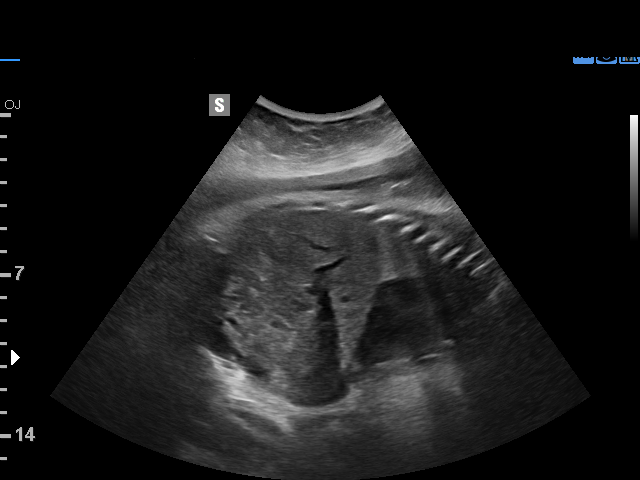
[im 4/21]
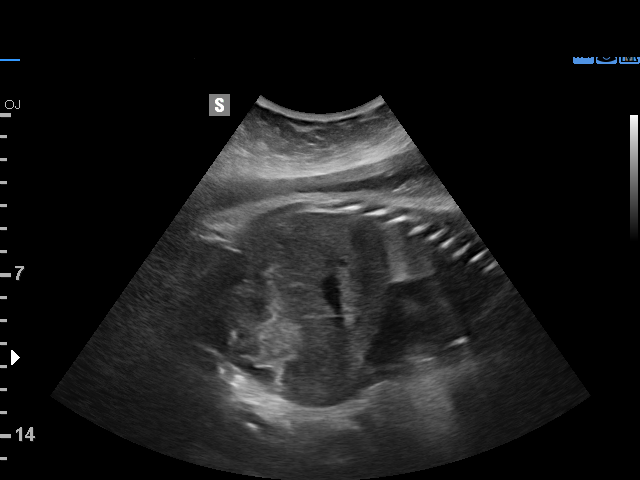
[im 5/21]
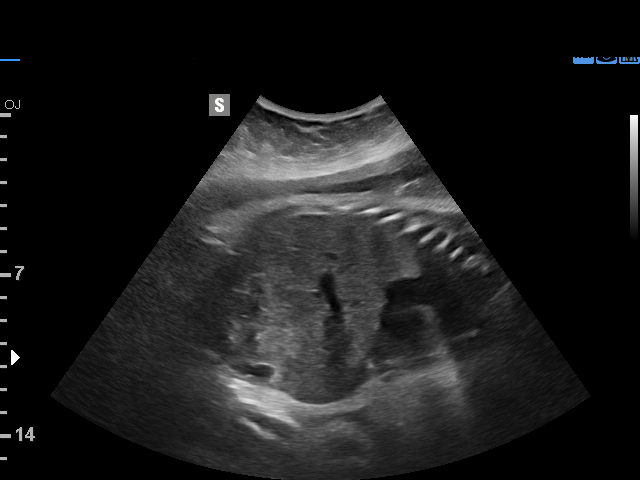
[im 7/21]
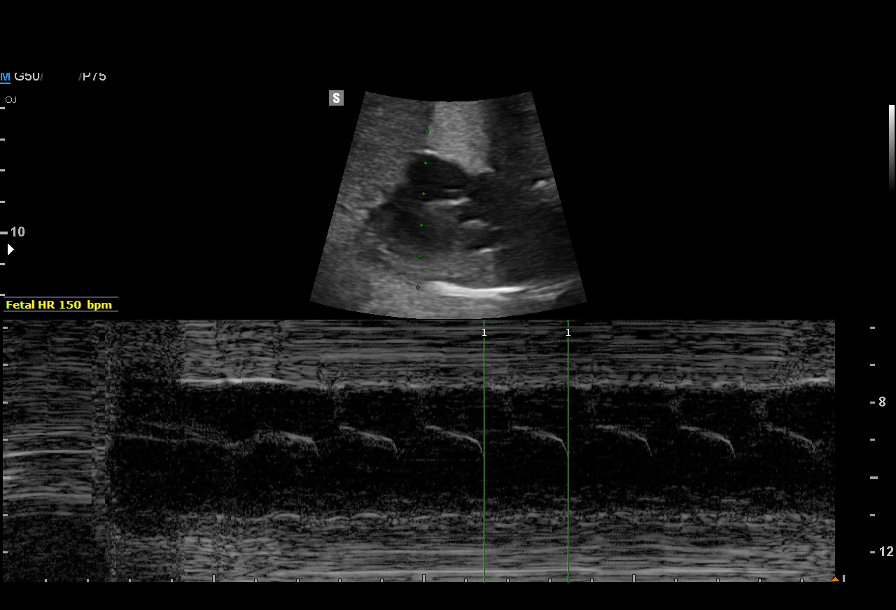
[im 8/21]
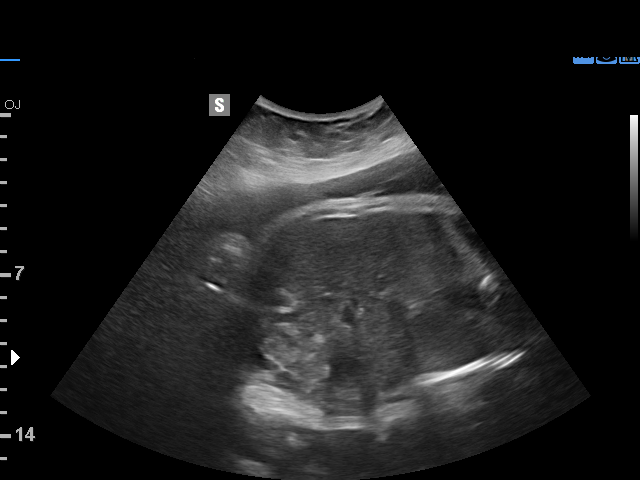
[im 10/21]
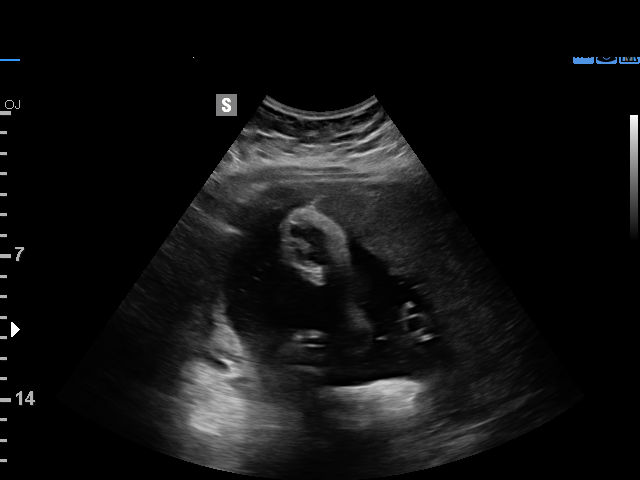
[im 11/21]
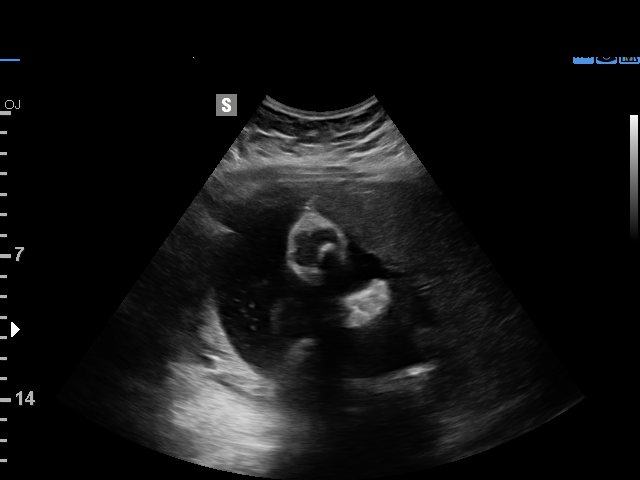
[im 12/21]
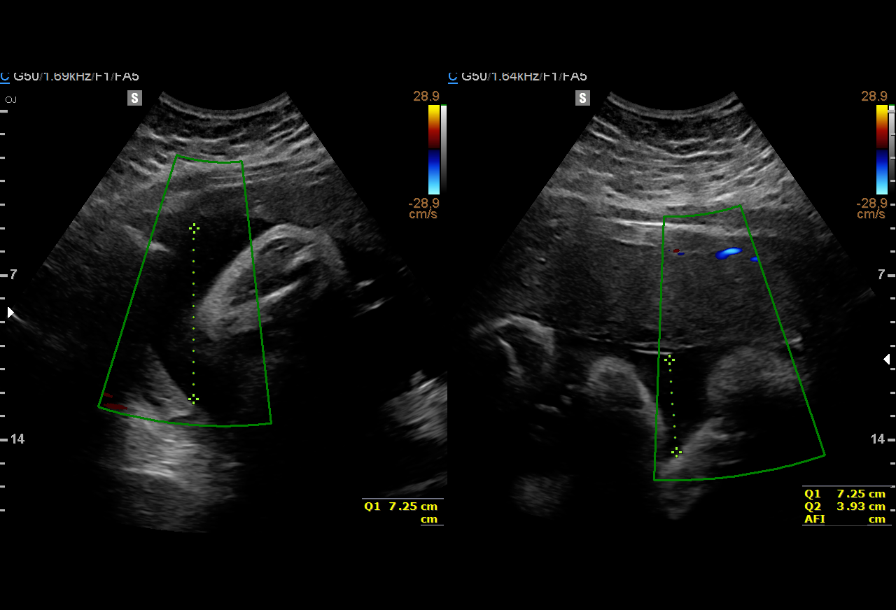
[im 14/21]
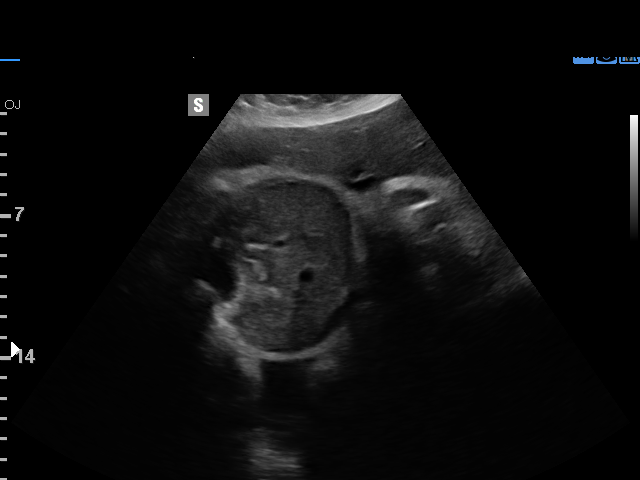
[im 15/21]
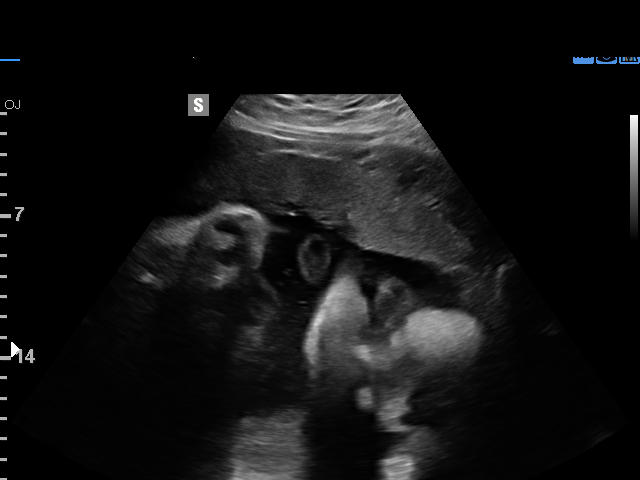
[im 17/21]
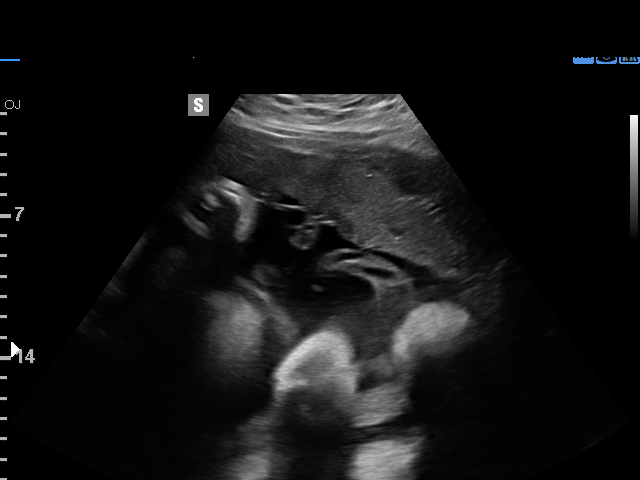
[im 18/21]
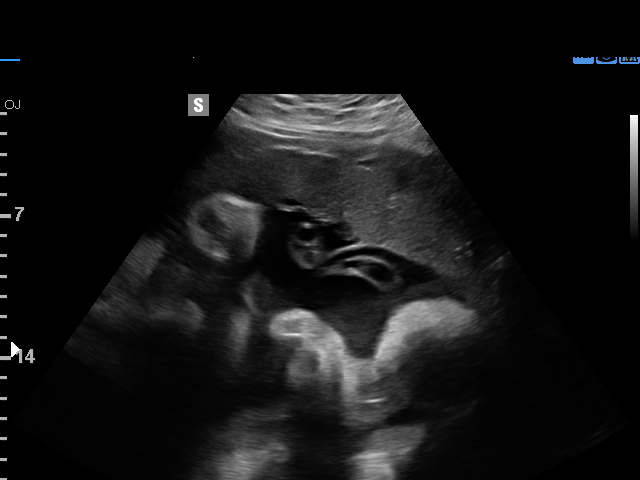
[im 19/21]
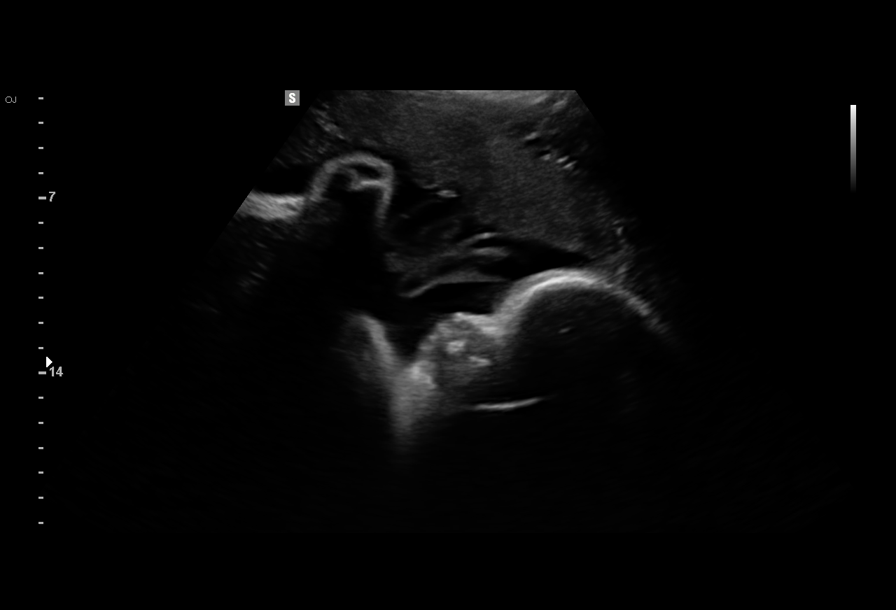
[im 21/21]
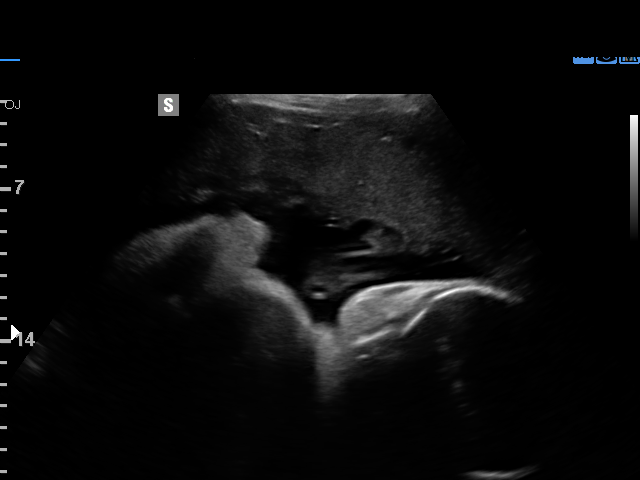

[15 of 21 positions shown; findings below may reference images not displayed]

[facility] Clinic
[REDACTED]-
Faculty Physician

1  CLOUT STRICKER             [id number]      [phone]     [phone]
Indications

35 weeks gestation of pregnancy
Hypertension - Chronic/Pre-existing (no
meds)
Previous cesarean delivery, antepartum
Maternal morbid obesity
OB History

Blood Type:            Height:  5'2"   Weight (lb):  301       BMI:
Gravidity:    5         Term:   1        Prem:   0         SAB:   3
TOP:          0       Ectopic:  0        Living: 1
Fetal Evaluation

Num Of Fetuses:     1
Fetal Heart         150
Rate(bpm):
Cardiac Activity:   Observed
Presentation:       Cephalic
Placenta:           Anterior, above cervical os

Amniotic Fluid
AFI FV:      Subjectively within normal limits

AFI Sum(cm)     %Tile       Largest Pocket(cm)
17.05           63

RUQ(cm)       RLQ(cm)       LUQ(cm)        LLQ(cm)
7.25
Biophysical Evaluation

Amniotic F.V:   Within normal limits       F. Tone:         Observed
F. Movement:    Observed                   Score:           [DATE]
F. Breathing:   Observed
Gestational Age

LMP:           34w 3d        Date:  06/05/15                 EDD:    03/11/16
Best:          35w 1d     Det. By:  Early Ultrasound         EDD:    03/06/16
(07/19/15)
Impression

SIUP at 35+2 weeks
Cephalic presentation
Normal amniotic fluid volume
BPP [DATE]
Recommendations

Continue twice weekly NSTs with weekly AFIs or weekly BPPs
Growth US in 
 2 weeks
